# Patient Record
Sex: Female | Born: 1990
Health system: Southern US, Community
[De-identification: ages and names within clinical notes are randomized; demographics above are authoritative.]

## PROBLEM LIST (undated history)

## (undated) ENCOUNTER — Inpatient Hospital Stay (HOSPITAL_COMMUNITY): Payer: Self-pay

## (undated) DIAGNOSIS — N898 Other specified noninflammatory disorders of vagina: Principal | ICD-10-CM

## (undated) DIAGNOSIS — B379 Candidiasis, unspecified: Secondary | ICD-10-CM

## (undated) DIAGNOSIS — Z8489 Family history of other specified conditions: Secondary | ICD-10-CM

## (undated) DIAGNOSIS — Z309 Encounter for contraceptive management, unspecified: Secondary | ICD-10-CM

## (undated) DIAGNOSIS — Z789 Other specified health status: Secondary | ICD-10-CM

## (undated) DIAGNOSIS — IMO0002 Reserved for concepts with insufficient information to code with codable children: Secondary | ICD-10-CM

## (undated) HISTORY — DX: Other specified noninflammatory disorders of vagina: N89.8

## (undated) HISTORY — DX: Other specified health status: Z78.9

## (undated) HISTORY — PX: NO PAST SURGERIES: SHX2092

## (undated) HISTORY — DX: Family history of other specified conditions: Z84.89

## (undated) HISTORY — DX: Encounter for contraceptive management, unspecified: Z30.9

## (undated) HISTORY — DX: Candidiasis, unspecified: B37.9

## (undated) HISTORY — DX: Reserved for concepts with insufficient information to code with codable children: IMO0002

---
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
## 2013-07-28 ENCOUNTER — Ambulatory Visit (INDEPENDENT_AMBULATORY_CARE_PROVIDER_SITE_OTHER): Payer: BC Managed Care – PPO | Admitting: Adult Health

## 2013-07-28 ENCOUNTER — Encounter (INDEPENDENT_AMBULATORY_CARE_PROVIDER_SITE_OTHER): Payer: Self-pay

## 2013-07-28 ENCOUNTER — Encounter: Payer: Self-pay | Admitting: Adult Health

## 2013-07-28 VITALS — BP 120/76 | Ht 63.0 in | Wt 178.0 lb

## 2013-07-28 DIAGNOSIS — Z309 Encounter for contraceptive management, unspecified: Secondary | ICD-10-CM

## 2013-07-28 DIAGNOSIS — Z3049 Encounter for surveillance of other contraceptives: Secondary | ICD-10-CM

## 2013-07-28 HISTORY — DX: Encounter for contraceptive management, unspecified: Z30.9

## 2013-07-28 MED ORDER — NORGESTIMATE-ETH ESTRADIOL 0.25-35 MG-MCG PO TABS: 1.0000 | ORAL_TABLET | Freq: Every day | ORAL | Status: DC

## 2013-07-28 NOTE — Patient Instructions (Signed)
Contraception Choices  Contraception (birth control) is the use of any methods or devices to prevent pregnancy. Below are some methods to help avoid pregnancy.  HORMONAL METHODS   · Contraceptive implant. This is a thin, plastic tube containing progesterone hormone. It does not contain estrogen hormone. Your caregiver inserts the tube in the inner part of the upper arm. The tube can remain in place for up to 3 years. After 3 years, the implant must be removed. The implant prevents the ovaries from releasing an egg (ovulation), thickens the cervical mucus which prevents sperm from entering the uterus, and thins the lining of the inside of the uterus.  · Progesterone-only injections. These injections are given every 3 months by your caregiver to prevent pregnancy. This synthetic progesterone hormone stops the ovaries from releasing eggs. It also thickens cervical mucus and changes the uterine lining. This makes it harder for sperm to survive in the uterus.  · Birth control pills. These pills contain estrogen and progesterone hormone. They work by stopping the egg from forming in the ovary (ovulation). Birth control pills are prescribed by a caregiver. Birth control pills can also be used to treat heavy periods.  · Minipill. This type of birth control pill contains only the progesterone hormone. They are taken every day of each month and must be prescribed by your caregiver.  · Birth control patch. The patch contains hormones similar to those in birth control pills. It must be changed once a week and is prescribed by a caregiver.  · Vaginal ring. The ring contains hormones similar to those in birth control pills. It is left in the vagina for 3 weeks, removed for 1 week, and then a new one is put back in place. The patient must be comfortable inserting and removing the ring from the vagina. A caregiver's prescription is necessary.  · Emergency contraception. Emergency contraceptives prevent pregnancy after unprotected  sexual intercourse. This pill can be taken right after sex or up to 5 days after unprotected sex. It is most effective the sooner you take the pills after having sexual intercourse. Emergency contraceptive pills are available without a prescription. Check with your pharmacist. Do not use emergency contraception as your only form of birth control.  BARRIER METHODS   · Female condom. This is a thin sheath (latex or rubber) that is worn over the penis during sexual intercourse. It can be used with spermicide to increase effectiveness.  · Female condom. This is a soft, loose-fitting sheath that is put into the vagina before sexual intercourse.  · Diaphragm. This is a soft, latex, dome-shaped barrier that must be fitted by a caregiver. It is inserted into the vagina, along with a spermicidal jelly. It is inserted before intercourse. The diaphragm should be left in the vagina for 6 to 8 hours after intercourse.  · Cervical cap. This is a round, soft, latex or plastic cup that fits over the cervix and must be fitted by a caregiver. The cap can be left in place for up to 48 hours after intercourse.  · Sponge. This is a soft, circular piece of polyurethane foam. The sponge has spermicide in it. It is inserted into the vagina after wetting it and before sexual intercourse.  · Spermicides. These are chemicals that kill or block sperm from entering the cervix and uterus. They come in the form of creams, jellies, suppositories, foam, or tablets. They do not require a prescription. They are inserted into the vagina with an applicator before having sexual intercourse.   IUD). This is a T-shaped device that is put in a woman's uterus during a menstrual period to prevent pregnancy. There are 2 types:  Copper IUD. This type of IUD is wrapped in copper wire and is placed inside the uterus. Copper makes the uterus and  fallopian tubes produce a fluid that kills sperm. It can stay in place for 10 years.  Hormone IUD. This type of IUD contains the hormone progestin (synthetic progesterone). The hormone thickens the cervical mucus and prevents sperm from entering the uterus, and it also thins the uterine lining to prevent implantation of a fertilized egg. The hormone can weaken or kill the sperm that get into the uterus. It can stay in place for 5 years. PERMANENT METHODS OF CONTRACEPTION  Female tubal ligation. This is when the woman's fallopian tubes are surgically sealed, tied, or blocked to prevent the egg from traveling to the uterus.  Female sterilization. This is when the female has the tubes that carry sperm tied off (vasectomy).This blocks sperm from entering the vagina during sexual intercourse. After the procedure, the man can still ejaculate fluid (semen). NATURAL PLANNING METHODS  Natural family planning. This is not having sexual intercourse or using a barrier method (condom, diaphragm, cervical cap) on days the woman could become pregnant.  Calendar method. This is keeping track of the length of each menstrual cycle and identifying when you are fertile.  Ovulation method. This is avoiding sexual intercourse during ovulation.  Symptothermal method. This is avoiding sexual intercourse during ovulation, using a thermometer and ovulation symptoms.  Post-ovulation method. This is timing sexual intercourse after you have ovulated. Regardless of which type or method of contraception you choose, it is important that you use condoms to protect against the transmission of sexually transmitted diseases (STDs). Talk with your caregiver about which form of contraception is most appropriate for you. Document Released: 10/02/2005 Document Revised: 12/25/2011 Document Reviewed: 02/08/2011 [facility] Patient Information 2014 [place], [place]. Follow up in 1 year Use condoms

## 2013-07-28 NOTE — Progress Notes (Signed)
Subjective:     Patient ID: Megan Lowe, female   DOB: 06/23/1991, 22 y.o.   MRN: [id number]  HPI Megan Lowe is a 22 year old white female in to get her OCs refilled, no complaints, likes the sprintec.  Review of Systems See HPI Reviewed past medical,surgical, social and family history. Reviewed medications and allergies.     Objective:   Physical Exam BP 120/76  Ht 5\' 3"  (1.6 m)  Wt 178 lb (80.74 kg)  BMI 31.54 kg/m2  LMP 07/16/2013    discussed the pill and she is happy with sprintec  Assessment:     Contraceptive management    Plan:     Refill sprintec x 1 year Follow up in 1 year, prn problems Pap at 22

## 2013-10-01 ENCOUNTER — Other Ambulatory Visit (HOSPITAL_COMMUNITY): Payer: Self-pay | Admitting: Family Medicine

## 2013-10-01 DIAGNOSIS — K2970 Gastritis, unspecified, without bleeding: Secondary | ICD-10-CM

## 2013-10-03 ENCOUNTER — Ambulatory Visit (HOSPITAL_COMMUNITY)
Admission: RE | Admit: 2013-10-03 | Discharge: 2013-10-03 | Disposition: A | Discharge status: Home / Self Care | Payer: BC Managed Care – PPO | Source: Ambulatory Visit | Attending: Family Medicine | Admitting: Family Medicine

## 2013-10-03 DIAGNOSIS — K2970 Gastritis, unspecified, without bleeding: Secondary | ICD-10-CM

## 2013-10-03 DIAGNOSIS — R109 Unspecified abdominal pain: Secondary | ICD-10-CM | POA: Insufficient documentation

## 2013-10-13 ENCOUNTER — Other Ambulatory Visit (HOSPITAL_COMMUNITY): Payer: Self-pay | Admitting: Family Medicine

## 2013-10-13 DIAGNOSIS — R1011 Right upper quadrant pain: Secondary | ICD-10-CM

## 2013-10-17 ENCOUNTER — Encounter (HOSPITAL_COMMUNITY)
Admission: RE | Admit: 2013-10-17 | Discharge: 2013-10-17 | Disposition: A | Discharge status: Home / Self Care | Payer: BC Managed Care – PPO | Source: Ambulatory Visit | Attending: Family Medicine | Admitting: Family Medicine

## 2013-10-17 ENCOUNTER — Encounter (HOSPITAL_COMMUNITY): Payer: Self-pay

## 2013-10-17 DIAGNOSIS — R1011 Right upper quadrant pain: Secondary | ICD-10-CM | POA: Insufficient documentation

## 2013-10-17 MED ORDER — SINCALIDE 5 MCG IJ SOLR
INTRAMUSCULAR | Status: AC
  Administered 2013-10-17: 1.64 ug via INTRAVENOUS
  Filled 2013-10-17: qty 5

## 2013-10-17 MED ORDER — STERILE WATER FOR INJECTION IJ SOLN
INTRAMUSCULAR | Status: AC
  Administered 2013-10-17: 5 mL via INTRAVENOUS
  Filled 2013-10-17: qty 10

## 2013-10-17 MED ORDER — TECHNETIUM TC 99M MEBROFENIN IV KIT
5.0000 | PACK | Freq: Once | INTRAVENOUS | Status: AC | PRN
  Administered 2013-10-17: 5 via INTRAVENOUS

## 2014-06-17 ENCOUNTER — Ambulatory Visit (INDEPENDENT_AMBULATORY_CARE_PROVIDER_SITE_OTHER): Payer: BC Managed Care – PPO | Admitting: Adult Health

## 2014-06-17 ENCOUNTER — Encounter: Payer: Self-pay | Admitting: Adult Health

## 2014-06-17 VITALS — BP 102/70 | Ht 63.0 in | Wt 190.0 lb

## 2014-06-17 DIAGNOSIS — Z3041 Encounter for surveillance of contraceptive pills: Secondary | ICD-10-CM

## 2014-06-17 MED ORDER — NORGESTIMATE-ETH ESTRADIOL 0.25-35 MG-MCG PO TABS: 1.0000 | ORAL_TABLET | Freq: Every day | ORAL | Status: DC

## 2014-06-17 NOTE — Patient Instructions (Signed)
Oral Contraception Use Oral contraceptive pills (OCPs) are medicines taken to prevent pregnancy. OCPs work by preventing the ovaries from releasing eggs. The hormones in OCPs also cause the cervical mucus to thicken, preventing the sperm from entering the uterus. The hormones also cause the uterine lining to become thin, not allowing a fertilized egg to attach to the inside of the uterus. OCPs are highly effective when taken exactly as prescribed. However, OCPs do not prevent sexually transmitted diseases (STDs). Safe sex practices, such as using condoms along with an OCP, can help prevent STDs. Before taking OCPs, you may have a physical exam and Pap test. Your health care provider may also order blood tests if necessary. Your health care provider will make sure you are a good candidate for oral contraception. Discuss with your health care provider the possible side effects of the OCP you may be prescribed. When starting an OCP, it can take 2 to 3 months for the body to adjust to the changes in hormone levels in your body.  HOW TO TAKE ORAL CONTRACEPTIVE PILLS Your health care provider may advise you on how to start taking the first cycle of OCPs. Otherwise, you can:   Start on day 1 of your menstrual period. You will not need any backup contraceptive protection with this start time.   Start on the first Sunday after your menstrual period or the day you get your prescription. In these cases, you will need to use backup contraceptive protection for the first week.   Start the pill at any time of your cycle. If you take the pill within 5 days of the start of your period, you are protected against pregnancy right away. In this case, you will not need a backup form of birth control. If you start at any other time of your menstrual cycle, you will need to use another form of birth control for 7 days. If your OCP is the type called a minipill, it will protect you from pregnancy after taking it for 2 days (48  hours). After you have started taking OCPs:   If you forget to take 1 pill, take it as soon as you remember. Take the next pill at the regular time.   If you miss 2 or more pills, call your health care provider because different pills have different instructions for missed doses. Use backup birth control until your next menstrual period starts.   If you use a 28-day pack that contains inactive pills and you miss 1 of the last 7 pills (pills with no hormones), it will not matter. Throw away the rest of the non-hormone pills and start a new pill pack.  No matter which day you start the OCP, you will always start a new pack on that same day of the week. Have an extra pack of OCPs and a backup contraceptive method available in case you miss some pills or lose your OCP pack.  HOME CARE INSTRUCTIONS   Do not smoke.   Always use a condom to protect against STDs. OCPs do not protect against STDs.   Use a calendar to mark your menstrual period days.   Read the information and directions that came with your OCP. Talk to your health care provider if you have questions.  SEEK MEDICAL CARE IF:   You develop nausea and vomiting.   You have abnormal vaginal discharge or bleeding.   You develop a rash.   You miss your menstrual period.   You are losing   your hair.   You need treatment for mood swings or depression.   You get dizzy when taking the OCP.   You develop acne from taking the OCP.   You become pregnant.  SEEK IMMEDIATE MEDICAL CARE IF:   You develop chest pain.   You develop shortness of breath.   You have an uncontrolled or severe headache.   You develop numbness or slurred speech.   You develop visual problems.   You develop pain, redness, and swelling in the legs.  Document Released: 09/21/2011 Document Revised: 02/16/2014 Document Reviewed: 03/23/2013 [facility] Patient Information 2015 [place], [place]. This information is not intended to replace  advice given to you by your health care provider. Make sure you discuss any questions you have with your health care provider. Return in 1 year for pap and physical

## 2014-06-17 NOTE — Progress Notes (Signed)
Subjective:     Patient ID: Megan Lowe, female   DOB: July 16, 1991, 23 y.o.   MRN: [id number]  HPI Megan Lowe is a 23 year old white female in to get OCs refilled, no complaints.  Review of Systems See HPI Reviewed past medical,surgical, social and family history. Reviewed medications and allergies.     Objective:   Physical Exam BP 102/70  Ht  (1.6 m)  Wt 190 lb (86.183 kg)  BMI 33.67 kg/m2  LMP 05/17/2014   Skin warm and dry.  Lungs: clear to ausculation bilaterally. Cardiovascular: regular rate and rhythm.  Assessment:     Contraceptive management    Plan:     Refilled sprintec x 1 year Return in 1 year for pap and physical Review handout on OC use

## 2014-10-27 ENCOUNTER — Ambulatory Visit: Payer: Self-pay | Admitting: Adult Health

## 2014-11-04 ENCOUNTER — Ambulatory Visit (INDEPENDENT_AMBULATORY_CARE_PROVIDER_SITE_OTHER): Payer: PRIVATE HEALTH INSURANCE | Admitting: Adult Health

## 2014-11-04 ENCOUNTER — Encounter: Payer: Self-pay | Admitting: Adult Health

## 2014-11-04 VITALS — BP 130/86 | Ht 63.0 in | Wt 190.0 lb

## 2014-11-04 DIAGNOSIS — N941 Dyspareunia: Secondary | ICD-10-CM

## 2014-11-04 DIAGNOSIS — IMO0002 Reserved for concepts with insufficient information to code with codable children: Secondary | ICD-10-CM

## 2014-11-04 DIAGNOSIS — N898 Other specified noninflammatory disorders of vagina: Secondary | ICD-10-CM

## 2014-11-04 DIAGNOSIS — B379 Candidiasis, unspecified: Secondary | ICD-10-CM

## 2014-11-04 HISTORY — DX: Candidiasis, unspecified: B37.9

## 2014-11-04 HISTORY — DX: Reserved for concepts with insufficient information to code with codable children: IMO0002

## 2014-11-04 HISTORY — DX: Other specified noninflammatory disorders of vagina: N89.8

## 2014-11-04 LAB — POCT WET PREP (WET MOUNT)

## 2014-11-04 MED ORDER — FLUCONAZOLE 150 MG PO TABS: ORAL_TABLET | ORAL | Status: DC

## 2014-11-04 NOTE — Progress Notes (Signed)
Subjective:     Patient ID: Megan Lowe, female   DOB: 04/07/1991, 24 y.o.   MRN: [id number]  HPI Megan Lowe is a 24 year old white female in complaining of vaginal discharge white to yellow and has pain with sex that has started in last several months.Same partner, uses condoms and is on the pill.  Review of Systems See HPI Reviewed past medical,surgical, social and family history. Reviewed medications and allergies.     Objective:   Physical Exam BP 130/86 mmHg  Ht 5\' 3"  (1.6 m)  Wt 190 lb (86.183 kg)  BMI 33.67 kg/m2  LMP 10/06/2014   Skin warm and dry.Pelvic: external genitalia is normal in appearance, vagina: white yellowish discharge without odor, cervix:smooth, no CMT, uterus: normal size, shape and contour, non tender, no masses felt, adnexa: no masses or tenderness noted. Wet prep: + for yeast and few WBCs. Can not reproduce pain that feels with sex.  Assessment:     Vaginal discharge Yeast Dyspareunia     Plan:     Rx diflucan 150 mg #2 take 1 now and 1 in 3 days with 1 refill Try astroglide with condoms Follow up prn Review handout on yeast

## 2014-11-04 NOTE — Patient Instructions (Signed)
Monilial Vaginitis Vaginitis in a soreness, swelling and redness (inflammation) of the vagina and vulva. Monilial vaginitis is not a sexually transmitted infection. CAUSES  Yeast vaginitis is caused by yeast (candida) that is normally found in your vagina. With a yeast infection, the candida has overgrown in number to a point that upsets the chemical balance. SYMPTOMS   White, thick vaginal discharge.  Swelling, itching, redness and irritation of the vagina and possibly the lips of the vagina (vulva).  Burning or painful urination.  Painful intercourse. DIAGNOSIS  Things that may contribute to monilial vaginitis are:  Postmenopausal and virginal states.  Pregnancy.  Infections.  Being tired, sick or stressed, especially if you had monilial vaginitis in the past.  Diabetes. Good control will help lower the chance.  Birth control pills.  Tight fitting garments.  Using bubble bath, feminine sprays, douches or deodorant tampons.  Taking certain medications that kill germs (antibiotics).  Sporadic recurrence can occur if you become ill. TREATMENT  Your caregiver will give you medication.  There are several kinds of anti monilial vaginal creams and suppositories specific for monilial vaginitis. For recurrent yeast infections, use a suppository or cream in the vagina 2 times a week, or as directed.  Anti-monilial or steroid cream for the itching or irritation of the vulva may also be used. Get your caregiver's permission.  Painting the vagina with methylene blue solution may help if the monilial cream does not work.  Eating yogurt may help prevent monilial vaginitis. HOME CARE INSTRUCTIONS   Finish all medication as prescribed.  Do not have sex until treatment is completed or after your caregiver tells you it is okay.  Take warm sitz baths.  Do not douche.  Do not use tampons, especially scented ones.  Wear cotton underwear.  Avoid tight pants and panty  hose.  Tell your sexual partner that you have a yeast infection. They should go to their caregiver if they have symptoms such as mild rash or itching.  Yotakeur sexual partner should be treated as well if your infection is difficult to eliminate.  Practice safer sex. Use condoms.  Some vaginal medications cause latex condoms to fail. Vaginal medications that harm condoms are:  Cleocin cream.  Butoconazole (Femstat).  Terconazole (Terazol) vaginal suppository.  Miconazole (Monistat) (may be purchased over the counter). SEEK MEDICAL CARE IF:   You have a temperature by mouth above 102 F (38.9 C).  The infection is getting worse after 2 days of treatment.  The infection is not getting better after 3 days of treatment.  You develop blisters in or around your vagina.  You develop vaginal bleeding, and it is not your menstrual period.  You have pain when you urinate.  You develop intestinal problems.  You have pain with sexual intercourse. Document Released: 07/12/2005 Document Revised: 12/25/2011 Document Reviewed: 03/26/2009 [facility] Patient Information 2015 [place]. This information is not intended to replace advice given to you by your health care provider. Make sure you discuss any questions you have with your health care provider.take diflucan, use astroglide with condoms

## 2015-01-24 ENCOUNTER — Emergency Department (HOSPITAL_COMMUNITY): Payer: PRIVATE HEALTH INSURANCE

## 2015-01-24 ENCOUNTER — Emergency Department (HOSPITAL_COMMUNITY)
Admission: EM | Admit: 2015-01-24 | Discharge: 2015-01-24 | Disposition: A | Discharge status: Home / Self Care | Payer: PRIVATE HEALTH INSURANCE | Attending: Emergency Medicine | Admitting: Emergency Medicine

## 2015-01-24 ENCOUNTER — Encounter (HOSPITAL_COMMUNITY): Payer: Self-pay | Admitting: Emergency Medicine

## 2015-01-24 DIAGNOSIS — Z87448 Personal history of other diseases of urinary system: Secondary | ICD-10-CM | POA: Diagnosis not present

## 2015-01-24 DIAGNOSIS — Z8619 Personal history of other infectious and parasitic diseases: Secondary | ICD-10-CM | POA: Insufficient documentation

## 2015-01-24 DIAGNOSIS — Z79899 Other long term (current) drug therapy: Secondary | ICD-10-CM | POA: Diagnosis not present

## 2015-01-24 DIAGNOSIS — R079 Chest pain, unspecified: Secondary | ICD-10-CM

## 2015-01-24 MED ORDER — TRAMADOL HCL 50 MG PO TABS: 50.0000 mg | ORAL_TABLET | Freq: Four times a day (QID) | ORAL | Status: DC | PRN

## 2015-01-24 MED ORDER — IBUPROFEN 800 MG PO TABS: 800.0000 mg | ORAL_TABLET | Freq: Three times a day (TID) | ORAL | Status: DC

## 2015-01-24 NOTE — Discharge Instructions (Signed)

## 2015-01-24 NOTE — ED Provider Notes (Signed)
CSN: [id number]     Arrival date & time 01/24/15  1424 History   First MD Initiated Contact with Patient 01/24/15 1657     Chief Complaint  Patient presents with  . Chest Pain     (Consider location/radiation/quality/duration/timing/severity/associated sxs/prior Treatment) HPI Comments: Patient presents to the ER for evaluation of chest pain. Patient reports that she was awakened from sleep at 4 AM with upper chest pain that was sharp in nature. Since then she has been experiencing dull aching pain in the lower central chest. She reports that she has had intermittently sharp upper and low dull aching pain through the course of today. She has noticed that worsens when she is been sober and squats to pick things up. She also notices some pain with a deep breath, but is not short of breath. She has not had any associated nausea, vomiting, diarrhea or abdominal pain.  Patient is a 24 y.o. female presenting with chest pain.  Chest Pain   Past Medical History  Diagnosis Date  . Contraceptive management 07/28/2013  . Vaginal discharge 11/04/2014  . Yeast infection 11/04/2014  . Dyspareunia 11/04/2014   History reviewed. No pertinent past surgical history. Family History  Problem Relation Age of Onset  . Hypertension Mother   . Cancer Paternal Grandfather     skin    History  Substance Use Topics  . Smoking status: Never Smoker   . Smokeless tobacco: Never Used  . Alcohol Use: No   OB History    Gravida Para Term Preterm AB TAB SAB Ectopic Multiple Living   0 0 0 0 0 0 0 0 0 0      Review of Systems  Cardiovascular: Positive for chest pain.  All other systems reviewed and are negative.     Allergies  Review of patient's allergies indicates no known allergies.  Home Medications   Prior to Admission medications   Medication Sig Start Date End Date Taking? Authorizing Provider  norgestimate-ethinyl estradiol (SPRINTEC 28) 0.25-35 MG-MCG tablet Take 1 tablet by mouth daily.  06/17/14  Yes Adline PotterJennifer A Griffin, NP  fluconazole (DIFLUCAN) 150 MG tablet Take 1 now and 1 in 3 days Patient not taking: Reported on 01/24/2015 11/04/14   Adline PotterJennifer A Griffin, NP  ibuprofen (ADVIL,MOTRIN) 800 MG tablet Take 1 tablet (800 mg total) by mouth 3 (three) times daily. 01/24/15   Gilda Creasehristopher J Pollina, MD  traMADol (ULTRAM) 50 MG tablet Take 1 tablet (50 mg total) by mouth every 6 (six) hours as needed. 01/24/15   Gilda Creasehristopher J Pollina, MD   BP 115/78 mmHg  Pulse 72  Temp(Src) 99 F (37.2 C) (Oral)  Resp 18  Ht 5\' 3"  (1.6 m)  Wt 180 lb (81.647 kg)  BMI 31.89 kg/m2  SpO2 100%  LMP 01/08/2015 (Approximate) Physical Exam  Constitutional: She is oriented to person, place, and time. She appears well-developed and well-nourished. No distress.  HENT:  Head: Normocephalic and atraumatic.  Right Ear: Hearing normal.  Left Ear: Hearing normal.  Nose: Nose normal.  Mouth/Throat: Oropharynx is clear and moist and mucous membranes are normal.  Eyes: Conjunctivae and EOM are normal. Pupils are equal, round, and reactive to light.  Neck: Normal range of motion. Neck supple.  Cardiovascular: Regular rhythm, S1 normal and S2 normal.  Exam reveals no gallop and no friction rub.   No murmur heard. Pulmonary/Chest: Effort normal and breath sounds normal. No respiratory distress. She exhibits tenderness. She exhibits no crepitus.    Abdominal: Soft. Normal  appearance and bowel sounds are normal. There is no hepatosplenomegaly. There is no tenderness. There is no rebound, no guarding, no tenderness at McBurney's point and negative Murphy's sign. No hernia.  Musculoskeletal: Normal range of motion.  Neurological: She is alert and oriented to person, place, and time. She has normal strength. No cranial nerve deficit or sensory deficit. Coordination normal. GCS eye subscore is 4. GCS verbal subscore is 5. GCS motor subscore is 6.  Skin: Skin is warm, dry and intact. No rash noted. No cyanosis.    Psychiatric: She has a normal mood and affect. Her speech is normal and behavior is normal. Thought content normal.  Nursing note and vitals reviewed.   ED Course  Procedures (including critical care time) Labs Review Labs Reviewed - No data to display  Imaging Review Dg Chest 2 View  01/24/2015   CLINICAL DATA:  Chest pain starting at 4 a.m.  EXAM: CHEST  2 VIEW  COMPARISON:  None.  FINDINGS: Cardiopericardial silhouette within normal limits. Mediastinal contours normal. Trachea midline. No airspace disease or effusion.  IMPRESSION: No active cardiopulmonary disease.   Electronically Signed   By: Andreas Newport M.D.   On: 01/24/2015 18:44     EKG Interpretation   Date/Time:  Sunday January 24 2015 14:35:10 EDT Ventricular Rate:  80 PR Interval:  150 QRS Duration: 86 QT Interval:  352 QTC Calculation: 405 R Axis:   22 Text Interpretation:  Normal sinus rhythm Normal ECG Confirmed by POLLINA   MD, CHRISTOPHER [phone]) on 01/24/2015 5:05:40 PM      MDM   Final diagnoses:  Chest pain    Patient presents to the ER for evaluation of chest pain. Patient reports that she has been experiencing chest pain since 4 AM this morning. Patient does have particular tenderness over the anterior chest wall which reproduces her pain. She does not have any cardiac risk factors. She is not tachycardic. She has normal oxygenation. No concern for PE based on PERC criteria. EKG is normal. Chest x-ray was performed, no acute abnormality noted. Patient was reassured, treat chest wall pain with analgesia and rest.    Gilda Crease, MD 01/24/15 2052

## 2015-01-24 NOTE — ED Notes (Signed)
Dr. Blinda LeatherwoodPollina in room to see patient at this time.

## 2015-01-24 NOTE — ED Notes (Signed)
Pt presents to ED complaining of central chest pain that started around 4am this morning.  It began behind her sternum and then moved to her diaphragm area, but now it is back up at the left sternal area.  She has taken 2 Ibuprofen tablets to help with the pain but there was no relief of symptoms.  Pain is rated as 6/10 and is described as alternating between sharp and dull.  At present it feels like a sharp pain. Pt denies nausea, vomiting, diarrhea, dizziness, lightheadedness.  States she did feel sweaty on Friday but not since her symptoms began this morning.

## 2015-06-15 ENCOUNTER — Other Ambulatory Visit (HOSPITAL_COMMUNITY)
Admission: RE | Admit: 2015-06-15 | Discharge: 2015-06-15 | Disposition: A | Discharge status: Home / Self Care | Payer: PRIVATE HEALTH INSURANCE | Source: Ambulatory Visit | Attending: Obstetrics & Gynecology | Admitting: Obstetrics & Gynecology

## 2015-06-15 ENCOUNTER — Ambulatory Visit (INDEPENDENT_AMBULATORY_CARE_PROVIDER_SITE_OTHER): Payer: PRIVATE HEALTH INSURANCE | Admitting: Women's Health

## 2015-06-15 ENCOUNTER — Encounter: Payer: Self-pay | Admitting: Women's Health

## 2015-06-15 VITALS — BP 104/62 | HR 64 | Ht 63.5 in | Wt 194.0 lb

## 2015-06-15 DIAGNOSIS — Z01419 Encounter for gynecological examination (general) (routine) without abnormal findings: Secondary | ICD-10-CM

## 2015-06-15 DIAGNOSIS — Z113 Encounter for screening for infections with a predominantly sexual mode of transmission: Secondary | ICD-10-CM | POA: Diagnosis present

## 2015-06-15 DIAGNOSIS — N889 Noninflammatory disorder of cervix uteri, unspecified: Secondary | ICD-10-CM

## 2015-06-15 MED ORDER — NORGESTIMATE-ETH ESTRADIOL 0.25-35 MG-MCG PO TABS: 1.0000 | ORAL_TABLET | Freq: Every day | ORAL | Status: DC

## 2015-06-15 NOTE — Progress Notes (Signed)
Patient ID: Megan Lowe, female   DOB: July 10, 1991, 24 y.o.   MRN: [id number] Subjective:   Megan Lowe is a 24 y.o. G52P0010 Caucasian female here for a routine well-woman exam.  Patient's last menstrual period was 05/19/2015.    Current complaints: none PCP: [facility]       Does not desire labs  Social History: Sexual: heterosexual Marital Status: dating Living situation: with mother Occupation: Agricultural consultant at [employer], Buyer, retail at [employer] Tobacco/alcohol: no tobacco or etoh Illicit drugs: no history of illicit drug use  The following portions of the patient's history were reviewed and updated as appropriate: allergies, current medications, past family history, past medical history, past social history, past surgical history and problem list.  Past Medical History Past Medical History  Diagnosis Date  . Contraceptive management 07/28/2013  . Vaginal discharge 11/04/2014  . Yeast infection 11/04/2014  . Dyspareunia 11/04/2014    Past Surgical History History reviewed. No pertinent past surgical history.  Gynecologic History G0P0000  Patient's last menstrual period was 05/19/2015. Contraception: OCP (estrogen/progesterone) Last Pap: never. Results were: n/a Last mammogram: never. Results were: n/a Last TCS: never  Obstetric History OB History  Gravida Para Term Preterm AB SAB TAB Ectopic Multiple Living         Current Medications Current Outpatient Prescriptions on File Prior to Visit  Medication Sig Dispense Refill  . norgestimate-ethinyl estradiol (SPRINTEC 28) 0.25-35 MG-MCG tablet Take 1 tablet by mouth daily. 1 Package 11  . fluconazole (DIFLUCAN) 150 MG tablet Take 1 now and 1 in 3 days (Patient not taking: Reported on 01/24/2015) 2 tablet 1  . ibuprofen (ADVIL,MOTRIN) 800 MG tablet Take 1 tablet (800 mg total) by mouth 3 (three) times daily. (Patient not taking: Reported on 06/15/2015) 21 tablet 0  . traMADol (ULTRAM) 50  MG tablet Take 1 tablet (50 mg total) by mouth every 6 (six) hours as needed. (Patient not taking: Reported on 06/15/2015) 15 tablet 0   No current facility-administered medications on file prior to visit.    Review of Systems Patient denies any headaches, blurred vision, shortness of breath, chest pain, abdominal pain, problems with bowel movements, urination, or intercourse.  Objective:  BP 104/62 mmHg  Pulse 64  Ht 5' 3.5" (1.613 m)  Wt 194 lb (87.998 kg)  BMI 33.82 kg/m2  LMP 05/19/2015 Physical Exam  General:  Well developed, well nourished, no acute distress. She is alert and oriented x3. Skin:  Warm and dry Neck:  Midline trachea, no thyromegaly or nodules Cardiovascular: Regular rate and rhythm, no murmur heard Lungs:  Effort normal, all lung fields clear to auscultation bilaterally Breasts:  No dominant palpable mass, retraction, or nipple discharge Abdomen:  Soft, non tender, no hepatosplenomegaly or masses Pelvic:  External genitalia is normal in appearance.  The vagina is normal in appearance. The cervix is bulbous, with a large fleshy pink bulbous anterior portion visualized and palpated w/ bimanual- app size of grape- no CMT. Co-exam of cx w/ JAG as MD not in building. Thin prep pap is done w/ reflex HR HPV cotesting. Uterus is felt to be normal size, shape, and contour.  No adnexal masses or tenderness noted. Extremities:  No swelling or varicosities noted Psych:  She has a normal mood and affect  Assessment:   Healthy well-woman exam Large anterior portion of cervix Plan:  GC/CT from pap today, declines any other labs Refilled sprintec  F/U 1wk  for visit w/ MD to assess cx, or sooner if needed Mammogram @24yo  or sooner if problems Colonoscopy @24yo  or sooner if problems  Marge Duncans CNM, [facility]NP-BC 06/15/2015 9:03 AM

## 2015-06-16 LAB — CYTOLOGY - PAP

## 2015-06-22 ENCOUNTER — Ambulatory Visit (INDEPENDENT_AMBULATORY_CARE_PROVIDER_SITE_OTHER): Payer: PRIVATE HEALTH INSURANCE | Admitting: Obstetrics & Gynecology

## 2015-06-22 ENCOUNTER — Encounter: Payer: Self-pay | Admitting: Obstetrics & Gynecology

## 2015-06-22 VITALS — BP 100/70 | HR 68 | Wt 193.0 lb

## 2015-06-22 DIAGNOSIS — N888 Other specified noninflammatory disorders of cervix uteri: Secondary | ICD-10-CM | POA: Diagnosis not present

## 2015-06-22 NOTE — Progress Notes (Signed)
Patient ID: Megan Lowe, female   DOB: 1991-09-17, 24 y.o.   MRN: [id number] Chief Complaint  Patient presents with  . Follow-up    recheck cyst on cervix.    Blood pressure 100/70, pulse 68, weight 193 lb (87.544 kg), last menstrual period 05/19/2015.  23 y.o. G1P0010 Patient's last menstrual period was 05/19/2015. The current method of family planning is OCP (estrogen/progesterone).  Subjective Pt seen for follow up of a cervical lesion to be evaluated Cytology was normal on Pap last week  Objective Cervix Nabothian gland cyst was present no other lesions, normal variant  Pertinent ROS No burning with urination, frequency or urgency No nausea, vomiting or diarrhea Nor fever chills or other constitutional symptoms   Labs or studies Pap was normal    Impression Diagnoses this Encounter::   ICD-9-CM ICD-10-CM   1. Nabothian (gland) cyst or follicle 616.0 N88.8     Established relevant diagnosis(es):   Plan/Recommendations: No orders of the defined types were placed in this encounter.    Labs or Scans Ordered: No orders of the defined types were placed in this encounter.      Follow up Return if symptoms worsen or fail to improve, for yearly with Joellyn Haff.      Face to face time:  10 minutes  Greater than 50% of the visit time was spent in counseling and coordination of care with the patient.  The summary and outline of the counseling and care coordination is summarized in the note above.   All questions were answered.

## 2015-08-03 ENCOUNTER — Encounter: Payer: Self-pay | Admitting: Obstetrics & Gynecology

## 2015-08-03 ENCOUNTER — Ambulatory Visit (INDEPENDENT_AMBULATORY_CARE_PROVIDER_SITE_OTHER): Payer: PRIVATE HEALTH INSURANCE | Admitting: Obstetrics & Gynecology

## 2015-08-03 VITALS — BP 98/60 | HR 76 | Wt 193.0 lb

## 2015-08-03 DIAGNOSIS — N930 Postcoital and contact bleeding: Secondary | ICD-10-CM | POA: Diagnosis not present

## 2015-08-03 DIAGNOSIS — S3763XA Laceration of uterus, initial encounter: Secondary | ICD-10-CM

## 2015-08-03 NOTE — Progress Notes (Signed)
Patient ID: Megan Lowe, female   DOB: 06/29/1991, [age] y.o.   MRN: [id number] Chief Complaint  Patient presents with  . work-in- gyn visit    cramp radating to back/ bloodydischarge/ nausea and dizzy.    Blood pressure 98/60, pulse 76, weight 193 lb (87.544 kg), last menstrual period 07/14/2015.  23 y.o. G1P0010 Patient's last menstrual period was 07/14/2015. The current method of family planning is oral OCP  Subjective Pt with post coital bleeding from 2 night before last Never happened before No fever   Objective Gen. well-developed well-nourished female in no acute distress NEFG Vagina no discharge Cervix multiple small lacerations of the cervix almost appear to be fissures, patient denies any unusual sexual activity or instrumentation, she denies any vaginal or pelvic pain  Pertinent ROS  No burning with urination, frequency or urgency No , vomiting or diarrhea Nor fever chills or other constitutional symptoms    Labs or studies None    Impression Diagnoses this Encounter::   ICD-9-CM ICD-10-CM   1. PCB (post coital bleeding) 626.7 N93.0   2. Cervical laceration, initial encounter 867.4 S37.69XA     Established relevant diagnosis(es):   Plan/Recommendations: No orders of the defined types were placed in this encounter.    Labs or Scans Ordered: No orders of the defined types were placed in this encounter.    No sex for 3 days Unsure why this occurred, did not use any instruments and pt states was not rough but she has 3-4 small almost paper cut type lacerations of the cervix, not actively bleeding, no specific therapy is needed Follow up if symptoms or other problems occur  Follow up Return if symptoms worsen or fail to improve.     All questions were answered.

## 2015-08-04 ENCOUNTER — Ambulatory Visit: Payer: PRIVATE HEALTH INSURANCE | Admitting: Women's Health

## 2015-10-21 DIAGNOSIS — H5213 Myopia, bilateral: Secondary | ICD-10-CM | POA: Diagnosis not present

## 2015-10-26 ENCOUNTER — Ambulatory Visit: Payer: PRIVATE HEALTH INSURANCE

## 2015-12-15 ENCOUNTER — Encounter: Payer: Self-pay | Admitting: Women's Health

## 2015-12-15 ENCOUNTER — Ambulatory Visit (INDEPENDENT_AMBULATORY_CARE_PROVIDER_SITE_OTHER): Payer: PRIVATE HEALTH INSURANCE | Admitting: Women's Health

## 2015-12-15 VITALS — BP 102/78 | HR 88 | Wt 183.0 lb

## 2015-12-15 DIAGNOSIS — N888 Other specified noninflammatory disorders of cervix uteri: Secondary | ICD-10-CM

## 2015-12-15 DIAGNOSIS — Z3202 Encounter for pregnancy test, result negative: Secondary | ICD-10-CM | POA: Diagnosis not present

## 2015-12-15 DIAGNOSIS — N939 Abnormal uterine and vaginal bleeding, unspecified: Secondary | ICD-10-CM | POA: Diagnosis not present

## 2015-12-15 LAB — POCT WET PREP (WET MOUNT): Clue Cells Wet Prep Whiff POC: NEGATIVE

## 2015-12-15 LAB — POCT URINE PREGNANCY: Preg Test, Ur: NEGATIVE

## 2015-12-15 NOTE — Addendum Note (Signed)
Addended by: Gaylyn Rong A on: 12/15/2015 04:50 PM   Modules accepted: Orders

## 2015-12-15 NOTE — Progress Notes (Signed)
Patient ID: LAPORSHIA HOGEN, female   DOB: 05-30-1991, 25 y.o.   MRN: [id number]   [facility] Visit  Patient name: Megan Lowe MRN [id number]  Date of birth: 07-Nov-1990  CC & HPI:  Megan Lowe is a 25 y.o. G35P0010 Caucasian female presenting today for report of heavy vag bleeding today. Was changing saturated regular tampon q 1hr earlier w/ +cramping, no clots. Takes COCs, has always had regular periods. Last period 2/16-2/20 and normal other than passing one small clot. Denies abnormal d/c, itching/odor/irritation, no reason to suspect pregnancy- hasn't missed any pills, no reason to suspect STD.  Unsure of last time she had sex but denies rough sex.  Patient's last menstrual period was 12/02/2015. The current method of family planning is OCP (estrogen/progesterone). Last pap 05/2015, neg  Pertinent History Reviewed:  Medical & Surgical Hx:   Past medical, surgical, family, and social history reviewed in electronic medical record Medications: Reviewed & Updated - see associated section Allergies: Reviewed in electronic medical record  Objective Findings:  Vitals: BP 102/78 mmHg  Pulse 88  Wt 183 lb (83.008 kg)  LMP 12/02/2015 Body mass index is 31.9 kg/(m^2).  Physical Examination: General appearance - alert, well appearing, and in no distress Pelvic - no blood in vaginal vault, cx closed, large nabothian cyst still present on anterior portion- app size of grape. Normal nonodorous d/c. No blood from os. Wet prep obtained  Results for orders placed or performed in visit on 12/15/15 (from the past 24 hour(s))  POCT Wet Prep Mellody Drown [place])   Collection Time: 12/15/15  4:27 PM  Result Value Ref Range   Source Wet Prep POC vaginal    WBC, Wet Prep HPF POC none    Bacteria Wet Prep HPF POC None None, Few, Too numerous to count   BACTERIA WET PREP MORPHOLOGY POC     Clue Cells Wet Prep HPF POC None None, Too numerous to count   Clue Cells Wet Prep Whiff POC Negative  Whiff    Yeast Wet Prep HPF POC None    KOH Wet Prep POC     Trichomonas Wet Prep HPF POC none     Urine preg test: neg  Assessment & Plan:  A:   Episode of heavy vag bleeding today, seems to have resolved  P:  GC/CT from urine  To monitor, if resumes/continues, let us know, will stop coc's and start megace algorithm   Return for Aug for physical.  Marge Duncans CNM, [facility] 12/15/2015 4:27 PM

## 2015-12-16 LAB — GC/CHLAMYDIA PROBE AMP
Chlamydia trachomatis, NAA: NEGATIVE
NEISSERIA GONORRHOEAE BY PCR: NEGATIVE

## 2015-12-17 ENCOUNTER — Telehealth: Payer: Self-pay | Admitting: Women's Health

## 2015-12-17 MED ORDER — MEGESTROL ACETATE 40 MG PO TABS: ORAL_TABLET | ORAL | Status: DC

## 2015-12-17 NOTE — Telephone Encounter (Signed)
Pt says bleeding with clots,stop OCs and will Rx megace, use condoms, call next week in follow up.Reviewed Kim's notes and plan.

## 2016-03-07 ENCOUNTER — Telehealth: Payer: Self-pay | Admitting: Women's Health

## 2016-03-07 NOTE — Telephone Encounter (Signed)
Left message x 1. JSY 

## 2016-03-07 NOTE — Telephone Encounter (Signed)
Left message x 2. JSY 

## 2016-03-07 NOTE — Telephone Encounter (Signed)
Spoke with pt. Pt wants to change birth control. I advised it would be best to schedule an appt to discuss different birth control options. Pt voiced understanding and call was transferred to front desk for appt. JSY

## 2016-03-14 ENCOUNTER — Ambulatory Visit: Payer: PRIVATE HEALTH INSURANCE | Admitting: Women's Health

## 2016-03-24 ENCOUNTER — Ambulatory Visit: Payer: PRIVATE HEALTH INSURANCE | Admitting: Adult Health

## 2016-03-28 DIAGNOSIS — Z1389 Encounter for screening for other disorder: Secondary | ICD-10-CM | POA: Diagnosis not present

## 2016-03-28 DIAGNOSIS — E6609 Other obesity due to excess calories: Secondary | ICD-10-CM | POA: Diagnosis not present

## 2016-03-28 DIAGNOSIS — Z6830 Body mass index (BMI) 30.0-30.9, adult: Secondary | ICD-10-CM | POA: Diagnosis not present

## 2016-03-28 DIAGNOSIS — B9562 Methicillin resistant Staphylococcus aureus infection as the cause of diseases classified elsewhere: Secondary | ICD-10-CM | POA: Diagnosis not present

## 2016-04-05 ENCOUNTER — Encounter: Payer: Self-pay | Admitting: Advanced Practice Midwife

## 2016-04-05 ENCOUNTER — Ambulatory Visit (INDEPENDENT_AMBULATORY_CARE_PROVIDER_SITE_OTHER): Payer: PRIVATE HEALTH INSURANCE | Admitting: Advanced Practice Midwife

## 2016-04-05 VITALS — BP 118/82 | HR 60 | Ht 62.0 in | Wt 181.0 lb

## 2016-04-05 DIAGNOSIS — Z3202 Encounter for pregnancy test, result negative: Secondary | ICD-10-CM

## 2016-04-05 DIAGNOSIS — N939 Abnormal uterine and vaginal bleeding, unspecified: Secondary | ICD-10-CM | POA: Diagnosis not present

## 2016-04-05 DIAGNOSIS — N921 Excessive and frequent menstruation with irregular cycle: Secondary | ICD-10-CM

## 2016-04-05 LAB — POCT URINE PREGNANCY: PREG TEST UR: NEGATIVE

## 2016-04-05 NOTE — Progress Notes (Signed)
   [facility] Visit  Patient name: Megan Lowe MRN [id number]  Date of birth: 04/25/1991  CC & HPI:  Megan Lowe is a 25 y.o. Caucasian female presenting today for what she described as heavy vaginal bleeding.  She called this am for an appt. She has not missed any pills, denies any vaginal itch/irritaion/odor.  Upon further discussion, she actually just had some bleeding when she wiped earlier today.    Pertinent History Reviewed:  Medical & Surgical Hx:   Past Medical History  Diagnosis Date  . Contraceptive management 07/28/2013  . Vaginal discharge 11/04/2014  . Yeast infection 11/04/2014  . Dyspareunia 11/04/2014   History reviewed. No pertinent past surgical history. Family History  Problem Relation Age of Onset  . Hypertension Mother   . Cancer Paternal Grandfather     skin     Current outpatient prescriptions:  .  norgestimate-ethinyl estradiol (SPRINTEC 28) 0.25-35 MG-MCG tablet, Take 1 tablet by mouth daily., Disp: 1 Package, Rfl: 11 Social History: Reviewed -  reports that she has never smoked. She has never used smokeless tobacco.  Review of Systems:   Constitutional: Negative for fever and chills Eyes: Negative for visual disturbances Respiratory: Negative for shortness of breath, dyspnea Cardiovascular: Negative for chest pain or palpitations  Gastrointestinal: Negative for vomiting, diarrhea and constipation; no abdominal pain Genitourinary: Negative for dysuria and urgency, vaginal irritation or itching Musculoskeletal: Negative for back pain, joint pain, myalgias  Neurological: Negative for dizziness and headaches    Objective Findings:    Physical Examination: General appearance - well appearing, and in no distress Mental status - alert, oriented to person, place, and time Chest:  Normal respiratory effort Heart - normal rate and regular rhythm Abdomen:  Soft, nontender Pelvic: SSE:  No blood whatsoever; normal appearing DC.  Wet  prep neg Musculoskeletal:  Normal range of motion without pain Extremities:  No edema    Results for orders placed or performed in visit on 04/05/16 (from the past 24 hour(s))  POCT urine pregnancy   Collection Time: 04/05/16  3:06 PM  Result Value Ref Range   Preg Test, Ur Negative Negative      Assessment & Plan:  A:   BTB on COCs P:     Return if symptoms worsen or fail to improve.  CRESENZO-DISHMAN,Keshauna Degraffenreid CNM 04/05/2016 4:04 PM

## 2016-04-17 ENCOUNTER — Ambulatory Visit: Payer: PRIVATE HEALTH INSURANCE | Admitting: Women's Health

## 2016-05-22 ENCOUNTER — Ambulatory Visit (INDEPENDENT_AMBULATORY_CARE_PROVIDER_SITE_OTHER): Payer: PRIVATE HEALTH INSURANCE | Admitting: Adult Health

## 2016-05-22 ENCOUNTER — Encounter: Payer: Self-pay | Admitting: Adult Health

## 2016-05-22 VITALS — BP 102/62 | HR 86 | Ht 63.0 in | Wt 184.0 lb

## 2016-05-22 DIAGNOSIS — Z3202 Encounter for pregnancy test, result negative: Secondary | ICD-10-CM

## 2016-05-22 DIAGNOSIS — Z30011 Encounter for initial prescription of contraceptive pills: Secondary | ICD-10-CM | POA: Diagnosis not present

## 2016-05-22 LAB — POCT URINE PREGNANCY: PREG TEST UR: NEGATIVE

## 2016-05-22 MED ORDER — NORETHIN-ETH ESTRAD-FE BIPHAS 1 MG-10 MCG / 10 MCG PO TABS: 1.0000 | ORAL_TABLET | Freq: Every day | ORAL | 4 refills | Status: DC

## 2016-05-22 NOTE — Progress Notes (Signed)
Subjective:     Patient ID: Megan Lowe, female   DOB: 07/12/1991, 25 y.o.   MRN: [id number]  HPI Megan Lowe is a 25 year old white female in to get birth control pills. She had been on sprintec but stopped due to cramps and BTB.  Review of Systems Patient denies any headaches, hearing loss, fatigue, blurred vision, shortness of breath, chest pain, abdominal pain, problems with bowel movements, urination, or intercourse. No joint pain or mood swings. Reviewed past medical,surgical, social and family history. Reviewed medications and allergies.     Objective:   Physical Exam BP 102/62 (BP Location: Left Arm, Patient Position: Sitting, Cuff Size: Normal)   Pulse 86   Ht 5\' 3"  (1.6 m)   Wt 184 lb (83.5 kg)   LMP 05/15/2016 (Exact Date)   BMI 32.59 kg/m  UPT negative, Skin warm and dry. Neck: mid line trachea, normal thyroid, good ROM, no lymphadenopathy noted. Lungs: clear to ausculation bilaterally. Cardiovascular: regular rate and rhythm.    Assessment:     Contraceptive management    Plan:     Rx lo loestrin disp 3 packs, take 1 daily with 4 refills,discount card given, a pack to start today, lot [id number] A exp 3/18 Use condoms for at least 1 month Follow up in 3 months or before if needed

## 2016-05-22 NOTE — Patient Instructions (Signed)
Start lo loestrin today Use condoms  Follow up in 3 months

## 2016-06-20 ENCOUNTER — Other Ambulatory Visit: Payer: PRIVATE HEALTH INSURANCE | Admitting: Women's Health

## 2016-08-14 ENCOUNTER — Telehealth: Payer: Self-pay | Admitting: Adult Health

## 2016-08-14 NOTE — Telephone Encounter (Signed)
Pt called stating that she would like a call back from [place] today. Pt did not state the reason why. Please contact pt

## 2016-08-14 NOTE — Telephone Encounter (Signed)
On lo loestrin for now but not sure if insurance will cover

## 2016-08-22 ENCOUNTER — Ambulatory Visit: Payer: PRIVATE HEALTH INSURANCE | Admitting: Adult Health

## 2016-10-18 ENCOUNTER — Telehealth: Payer: Self-pay | Admitting: *Deleted

## 2016-10-18 MED ORDER — NORETHIN ACE-ETH ESTRAD-FE 1-20 MG-MCG(24) PO TABS: 1.0000 | ORAL_TABLET | Freq: Every day | ORAL | 11 refills | Status: DC

## 2016-10-18 NOTE — Telephone Encounter (Signed)
Spoke with pt. Pt is on Lo Loestrin but wants a different pill due to cost. Pt has no insurance now. I offered pt to come by and pick up coupon card but she states she's already done that and wants something different and cheaper. Thanks!! JSY

## 2016-10-18 NOTE — Telephone Encounter (Signed)
Pt wants cheaper OC will rx loestrin 1/20, use condoms

## 2016-12-11 ENCOUNTER — Telehealth: Payer: Self-pay | Admitting: *Deleted

## 2016-12-11 NOTE — Telephone Encounter (Signed)
Pt states she is without insurance until April 1st and is wanting to know if she can get samples of her BCP so she doesn't miss any doses.  The only samples I see in the office is for Lo Loestrin and pt was switched to Loestrin, pt states change was just due to cost.  Is it OK to give her samples of Lo Loestrin?

## 2016-12-11 NOTE — Telephone Encounter (Signed)
Pt informed we will leave two boxes of the Lo Loestrin at the front desk for her to p/u but also to use condoms as well.  Pt voiced understanding.

## 2017-01-09 ENCOUNTER — Emergency Department (HOSPITAL_COMMUNITY)
Admission: EM | Admit: 2017-01-09 | Discharge: 2017-01-09 | Disposition: A | Discharge status: Home / Self Care | Payer: PRIVATE HEALTH INSURANCE | Attending: Emergency Medicine | Admitting: Emergency Medicine

## 2017-01-09 ENCOUNTER — Encounter (HOSPITAL_COMMUNITY): Payer: Self-pay | Admitting: Emergency Medicine

## 2017-01-09 DIAGNOSIS — R1031 Right lower quadrant pain: Secondary | ICD-10-CM | POA: Insufficient documentation

## 2017-01-09 DIAGNOSIS — R1032 Left lower quadrant pain: Secondary | ICD-10-CM | POA: Insufficient documentation

## 2017-01-09 DIAGNOSIS — R1030 Lower abdominal pain, unspecified: Secondary | ICD-10-CM

## 2017-01-09 LAB — URINALYSIS, ROUTINE W REFLEX MICROSCOPIC
BILIRUBIN URINE: NEGATIVE
GLUCOSE, UA: NEGATIVE mg/dL
Hgb urine dipstick: NEGATIVE
Ketones, ur: NEGATIVE mg/dL
Leukocytes, UA: NEGATIVE
NITRITE: NEGATIVE
PH: 6 (ref 5.0–8.0)
Protein, ur: NEGATIVE mg/dL
SPECIFIC GRAVITY, URINE: 1.016 (ref 1.005–1.030)

## 2017-01-09 LAB — CBC
HEMATOCRIT: 37 % (ref 36.0–46.0)
HEMOGLOBIN: 12.7 g/dL (ref 12.0–15.0)
MCH: 28.8 pg (ref 26.0–34.0)
MCHC: 34.3 g/dL (ref 30.0–36.0)
MCV: 83.9 fL (ref 78.0–100.0)
Platelets: 248 10*3/uL (ref 150–400)
RBC: 4.41 MIL/uL (ref 3.87–5.11)
RDW: 12.7 % (ref 11.5–15.5)
WBC: 10.2 10*3/uL (ref 4.0–10.5)

## 2017-01-09 LAB — COMPREHENSIVE METABOLIC PANEL
ALBUMIN: 4.2 g/dL (ref 3.5–5.0)
ALT: 15 U/L (ref 14–54)
ANION GAP: 8 (ref 5–15)
AST: 21 U/L (ref 15–41)
Alkaline Phosphatase: 55 U/L (ref 38–126)
BUN: 13 mg/dL (ref 6–20)
CHLORIDE: 103 mmol/L (ref 101–111)
CO2: 27 mmol/L (ref 22–32)
Calcium: 9.2 mg/dL (ref 8.9–10.3)
Creatinine, Ser: 0.79 mg/dL (ref 0.44–1.00)
GFR calc Af Amer: >60 mL/min (ref >60)
GFR calc non Af Amer: >60 mL/min (ref >60)
GLUCOSE: 96 mg/dL (ref 65–99)
POTASSIUM: 3.5 mmol/L (ref 3.5–5.1)
SODIUM: 138 mmol/L (ref 135–145)
TOTAL PROTEIN: 7.7 g/dL (ref 6.5–8.1)
Total Bilirubin: 0.4 mg/dL (ref 0.3–1.2)

## 2017-01-09 LAB — WET PREP, GENITAL
Clue Cells Wet Prep HPF POC: NONE SEEN
SPERM: NONE SEEN
TRICH WET PREP: NONE SEEN
YEAST WET PREP: NONE SEEN

## 2017-01-09 LAB — PREGNANCY, URINE: Preg Test, Ur: NEGATIVE

## 2017-01-09 LAB — LIPASE, BLOOD: LIPASE: 24 U/L (ref 11–51)

## 2017-01-09 NOTE — ED Provider Notes (Signed)
[facility]PT Provider Note   CSN: [id number] Arrival date & time: 01/09/17  1518     History   Chief Complaint Chief Complaint  Patient presents with  . Abdominal Pain    HPI Megan Lowe is a 25 y.o. female.  HPI Pt presents to the ED with complaints of lower abdominal pain.  Sx started a few days ago.  The pain is in the lower abdomen on both sides.  She has had some discomfort with urination.  No frequency .  No vomiting or diarrhea.  No constipation.  Her most recent period was irregular but no vaginal bleeding or discharge currently.  Pt is concerned because she was diagnosed with a cyst on her cervix in the past and wonders if it could be spreading.  Past Medical History:  Diagnosis Date  . Contraceptive management 07/28/2013  . Dyspareunia 11/04/2014  . Vaginal discharge 11/04/2014  . Yeast infection 11/04/2014    Patient Active Problem List   Diagnosis Date Noted  . Nabothian cyst 12/15/2015  . Vaginal discharge 11/04/2014  . Yeast infection 11/04/2014  . Dyspareunia 11/04/2014  . Contraceptive management 07/28/2013    History reviewed. No pertinent surgical history.  OB History    Gravida Para Term Preterm AB Living   1 0 0 0 1 0   SAB TAB Ectopic Multiple Live Births   0 0 0 0         Home Medications    Prior to Admission medications   Medication Sig Start Date End Date Taking? Authorizing Provider  Multiple Vitamin (MULTIVITAMIN WITH MINERALS) TABS tablet Take 1 tablet by mouth daily.   Yes Historical Provider, MD  Norethindrone Acetate-Ethinyl Estrad-FE (LOESTRIN 24 FE) 1-20 MG-MCG(24) tablet Take 1 tablet by mouth daily. 10/18/16  Yes Adline Potter, NP    Family History Family History  Problem Relation Age of Onset  . Hypertension Mother   . Cancer Paternal Grandfather     skin     Social History Social History  Substance Use Topics  . Smoking status: Never Smoker  . Smokeless tobacco: Never Used  . Alcohol use No      Allergies   Patient has no allergy information on record.   Review of Systems Review of Systems  All other systems reviewed and are negative.    Physical Exam Updated Vital Signs BP 131/84   Pulse 88   Temp 98.3 F (36.8 C)   Resp 18   Ht 5\' 3"  (1.6 m)   Wt 81.6 kg   LMP 12/31/2016   SpO2 100%   BMI 31.89 kg/m   Physical Exam  Constitutional: She appears well-developed and well-nourished. No distress.  HENT:  Head: Normocephalic and atraumatic.  Right Ear: External ear normal.  Left Ear: External ear normal.  Eyes: Conjunctivae are normal. Right eye exhibits no discharge. Left eye exhibits no discharge. No scleral icterus.  Neck: Neck supple. No tracheal deviation present.  Cardiovascular: Normal rate, regular rhythm and intact distal pulses.   Pulmonary/Chest: Effort normal and breath sounds normal. No stridor. No respiratory distress. She has no wheezes. She has no rales.  Abdominal: Soft. Bowel sounds are normal. She exhibits no distension. There is tenderness (mild suprpubic region). There is no rebound.  Genitourinary: Uterus normal. Pelvic exam was performed with patient supine. There is no rash, tenderness, lesion or injury on the right labia. There is no rash, tenderness, lesion or injury on the left labia. Uterus is not tender. Cervix  exhibits no motion tenderness, no discharge and no friability. Right adnexum displays no mass, no tenderness and no fullness. Left adnexum displays no mass, no tenderness and no fullness. No erythema or tenderness in the vagina. No foreign body in the vagina. No signs of injury around the vagina. No vaginal discharge found.  Musculoskeletal: She exhibits no edema or tenderness.  Neurological: She is alert. She has normal strength. No cranial nerve deficit (no facial droop, extraocular movements intact, no slurred speech) or sensory deficit. She exhibits normal muscle tone. She displays no seizure activity. Coordination normal.   Skin: Skin is warm and dry. No rash noted.  Psychiatric: She has a normal mood and affect.  Nursing note and vitals reviewed.    ED Treatments / Results  Labs (all labs ordered are listed, but only abnormal results are displayed) Labs Reviewed  WET PREP, GENITAL - Abnormal; Notable for the following:       Result Value   WBC, Wet Prep HPF POC MODERATE (*)    All other components within normal limits  URINALYSIS, ROUTINE W REFLEX MICROSCOPIC - Abnormal; Notable for the following:    Color, Urine STRAW (*)    All other components within normal limits  LIPASE, BLOOD  COMPREHENSIVE METABOLIC PANEL  CBC  PREGNANCY, URINE  RPR  HIV ANTIBODY (ROUTINE TESTING)  GC/CHLAMYDIA PROBE AMP ([place]) NOT [facility]     Procedures Procedures (including critical care time)  Medications Ordered in ED Medications - No data to display   Initial Impression / Assessment and Plan / ED Course  I have reviewed the triage vital signs and the nursing notes.  Pertinent labs & imaging results that were available during my care of the patient were reviewed by me and considered in my medical decision making (see chart for details).   Pt presents with complaints of lower abdominal pain.   No tenderness on exam.  Labs are reassuring.  Doubt PID, TOA, appendicitis.    May be related to her irregular menses recently.  Will dc home on nsaids.  Follow up with Ob G[facility]  Final Clinical Impressions(s) / ED Diagnoses   Final diagnoses:  Lower abdominal pain    New Prescriptions New Prescriptions   No medications on file     Linwood DibblesJon Rasa Degrazia, MD 01/09/17 1858

## 2017-01-09 NOTE — ED Notes (Signed)
This nurse assisted Dr. Lynelle DoctorKnapp with pelvic exam

## 2017-01-09 NOTE — ED Triage Notes (Signed)
Pt c/o lower abd pain and painful urination. Some nausea. Denies v/d. Denies vaginal bleeding or d/c.

## 2017-01-09 NOTE — Discharge Instructions (Signed)
Take the medications as prescribed, follow up with your primary care doctor next week, return for fever, vomiting, worsening symptoms

## 2017-01-10 LAB — RPR: RPR: NONREACTIVE

## 2017-01-10 LAB — HIV ANTIBODY (ROUTINE TESTING W REFLEX): HIV Screen 4th Generation wRfx: NONREACTIVE

## 2017-01-11 LAB — GC/CHLAMYDIA PROBE AMP (~~LOC~~) NOT AT ARMC
Chlamydia: NEGATIVE
Neisseria Gonorrhea: NEGATIVE

## 2017-01-16 ENCOUNTER — Ambulatory Visit: Payer: PRIVATE HEALTH INSURANCE | Admitting: Women's Health

## 2017-02-13 ENCOUNTER — Ambulatory Visit (INDEPENDENT_AMBULATORY_CARE_PROVIDER_SITE_OTHER): Payer: 59 | Admitting: Women's Health

## 2017-02-13 ENCOUNTER — Encounter: Payer: Self-pay | Admitting: Women's Health

## 2017-02-13 ENCOUNTER — Other Ambulatory Visit: Payer: Self-pay | Admitting: Women's Health

## 2017-02-13 VITALS — BP 110/60 | HR 84 | Ht 63.0 in | Wt 197.0 lb

## 2017-02-13 DIAGNOSIS — N941 Unspecified dyspareunia: Secondary | ICD-10-CM | POA: Diagnosis not present

## 2017-02-13 DIAGNOSIS — R102 Pelvic and perineal pain: Secondary | ICD-10-CM

## 2017-02-13 DIAGNOSIS — R309 Painful micturition, unspecified: Secondary | ICD-10-CM

## 2017-02-13 DIAGNOSIS — R300 Dysuria: Secondary | ICD-10-CM

## 2017-02-13 LAB — POCT URINALYSIS DIPSTICK
Blood, UA: NEGATIVE
GLUCOSE UA: NEGATIVE
Ketones, UA: NEGATIVE
LEUKOCYTES UA: NEGATIVE
NITRITE UA: NEGATIVE
Protein, UA: NEGATIVE

## 2017-02-13 LAB — POCT WET PREP (WET MOUNT)
CLUE CELLS WET PREP WHIFF POC: NEGATIVE
Trichomonas Wet Prep HPF POC: ABSENT

## 2017-02-13 NOTE — Patient Instructions (Signed)
Call us when your next period starts to schedule an ultrasound for when your period is off (about 7 days after it starts)

## 2017-02-13 NOTE — Progress Notes (Signed)
   [facility] Visit  Patient name: Megan Lowe MRN [id number]  Date of birth: 1990-10-23  CC & HPI:  Megan Lowe is a 26 y.o. G23P0010 Caucasian female presenting today for report of bilateral pelvic pain x 2 years, worse lately. Was intermittent at first, now seems to be more constant- sharp & shooting and radiates down into bilateral groins. Worse w/ urination, sex, bending, squatting, etc. Denies abnormal vag d/c, itching/odor/irritation. Denies uti s/s, other than dysuria, but states this does not feel like a uti and urine dipstick today is neg.  No LMP recorded. The current method of family planning is none. Last pap 06/15/15 neg  Pertinent History Reviewed:  Medical & Surgical Hx:   Past medical, surgical, family, and social history reviewed in electronic medical record Medications: Reviewed & Updated - see associated section Allergies: Reviewed in electronic medical record  Objective Findings:  Vitals: BP 110/60 (BP Location: Left Arm, Patient Position: Sitting, Cuff Size: Normal)   Pulse 84   Ht  (1.6 m)   Wt 197 lb (89.4 kg)   BMI 34.90 kg/m  Body mass index is 34.9 kg/m.  Physical Examination: General appearance - alert, well appearing, and in no distress Abdomen - + TTP bilateral lower pelvic area Pelvic - normal external genitalia, cx w/ large anterior nabothian cyst (previously seen) ~2cm. Normal d/c w/o odor. Bimanual: mild CMT and mild TTP uterus and bilateral adnexa L>R, no masses felt  Results for orders placed or performed in visit on 02/13/17 (from the past 24 hour(s))  POCT Urinalysis Dipstick   Collection Time: 02/13/17  9:24 AM  Result Value Ref Range   Color, UA     Clarity, UA     Glucose, UA neg    Bilirubin, UA     Ketones, UA neg    Spec Grav, UA  1.010 - 1.025   Blood, UA neg    pH, UA  5.0 - 8.0   Protein, UA neg    Urobilinogen, UA  0.2 or 1.0 E.U./dL   Nitrite, UA neg    Leukocytes, UA Negative Negative  POCT Wet Prep  Mellody [place]rown [place])   Collection Time: 02/13/17 10:09 AM  Result Value Ref Range   Source Wet Prep POC vaginal    WBC, Wet Prep HPF POC many    Bacteria Wet Prep HPF POC None (A) Few   BACTERIA WET PREP MORPHOLOGY POC     Clue Cells Wet Prep HPF POC None None   Clue Cells Wet Prep Whiff POC Negative Whiff    Yeast Wet Prep HPF POC None    KOH Wet Prep POC     Trichomonas Wet Prep HPF POC Absent Absent     Assessment & Plan:  A:   Bilateral lower pelvic pain  Dyspareunia  P:  GC/CT, if neg- will give antibiotics (many wbc's on wet prep)  Call when period starts to schedule u/s when period will be off (and f/u w/ me after)  Return for will call us for pelvic u/s when period starts (u/s when period is off).  Marge Duncans CNM, [facility] 02/13/2017 10:09 AM

## 2017-02-14 LAB — GC/CHLAMYDIA PROBE AMP
CHLAMYDIA, DNA PROBE: NEGATIVE
Neisseria gonorrhoeae by PCR: NEGATIVE

## 2017-02-15 ENCOUNTER — Telehealth: Payer: Self-pay | Admitting: Women's Health

## 2017-02-15 LAB — URINE CULTURE: Organism ID, Bacteria: NO GROWTH

## 2017-02-15 MED ORDER — DOXYCYCLINE HYCLATE 100 MG PO TABS: 100.0000 mg | ORAL_TABLET | Freq: Two times a day (BID) | ORAL | 0 refills | Status: DC

## 2017-02-15 NOTE — Telephone Encounter (Signed)
Pt called stating that she would like a call back from [place], Pt states that she had some blood work done and would like to know the results. Please contact pt

## 2017-02-15 NOTE — Telephone Encounter (Signed)
Called pt, notified gc/ct neg, rx doxycycline for cervicitis.  Cheral MarkerKimberly R. Ariya Bohannon, CNM, [facility] 02/15/2017 8:45 AM

## 2017-02-20 ENCOUNTER — Other Ambulatory Visit: Payer: Self-pay | Admitting: Women's Health

## 2017-02-20 DIAGNOSIS — R102 Pelvic and perineal pain: Secondary | ICD-10-CM

## 2017-02-20 DIAGNOSIS — N941 Unspecified dyspareunia: Secondary | ICD-10-CM

## 2017-02-20 DIAGNOSIS — R300 Dysuria: Secondary | ICD-10-CM

## 2017-02-22 ENCOUNTER — Ambulatory Visit (INDEPENDENT_AMBULATORY_CARE_PROVIDER_SITE_OTHER): Payer: 59

## 2017-02-22 ENCOUNTER — Ambulatory Visit (INDEPENDENT_AMBULATORY_CARE_PROVIDER_SITE_OTHER): Payer: 59 | Admitting: Women's Health

## 2017-02-22 ENCOUNTER — Encounter: Payer: Self-pay | Admitting: Women's Health

## 2017-02-22 VITALS — BP 118/76 | HR 77 | Wt 197.0 lb

## 2017-02-22 DIAGNOSIS — R300 Dysuria: Secondary | ICD-10-CM

## 2017-02-22 DIAGNOSIS — R102 Pelvic and perineal pain: Secondary | ICD-10-CM

## 2017-02-22 DIAGNOSIS — N941 Unspecified dyspareunia: Secondary | ICD-10-CM | POA: Diagnosis not present

## 2017-02-22 NOTE — Progress Notes (Signed)
   [facility] Visit  Patient name: Megan Lowe MRN [id number]  Date of birth: 10/16/1990  CC & HPI:  Megan Lowe is a 26 y.o. 231P0010 Caucasian female presenting today for f/u after pelvic u/s done for bilateral pelvic pain x 2 years, worse lately.  Was intermittent at first, now seems to be more constant- sharp & shooting and radiates down into bilateral groins. Worse w/ urination, sex, bending, squatting, etc. Stopped coc's couple of months ago- that's when pain became more constant rather than intermittent. Gets married 04/21/17, wants to try for pregnancy. Not taking pnv.  Patient's last menstrual period was 02/16/2017 (exact date). The current method of family planning is none. Last pap 06/15/15 neg  Pertinent History Reviewed:  Medical & Surgical Hx:   Past medical, surgical, family, and social history reviewed in electronic medical record Medications: Reviewed & Updated - see associated section Allergies: Reviewed in electronic medical record  Objective Findings:  Vitals: BP 118/76 (BP Location: Right Arm, Patient Position: Sitting, Cuff Size: Normal)   Pulse 77   Wt 197 lb (89.4 kg)   LMP 02/16/2017 (Exact Date)   BMI 34.90 kg/m  Body mass index is 34.9 kg/m.  Physical Examination: General appearance - alert, well appearing, and in no distress  Today's Pelvic U/S:  Megan Lowe is a 26 y.o. G1P0010 LMP 02/26/2017 for a pelvic sonogram for bilat pelvic pain.  Uterus                      6.3 x 3.2 x 4.2 cm, homogeneous anteverted uterus,wnl  Endometrium          4.4 mm, symmetrical, wnl  Right ovary             2.7 x 1.6 x 2.3 cm, wnl  Left ovary                3.2 x 1.4 x 2.1 cm, wnl  Simple nabothian cyst 1.4 x 1 x 1.8 cm  Technician Comments:  PELVIC US TA/TV: homogeneous anteverted uterus,wnl,normal ovaries bilat.,simple nabothian cyst 1.4 x 1 x 1.8 cm, EEC 4.4 mm,no free fluid,ovaries appear mobile,no pain during  ultrasound   [employer] Carl 02/22/2017 No results found for this or any previous visit (from the past 24 hour(s)).   Assessment & Plan:  A:   Bilateral pelvic pain  Trying to conceive  P:  No ultrasonographic etiology for her pain, discussed could potentially be r/t endometriosis, esp since worsened when stopped coc's- discussed this is can only be dx w/ surgery  Recommended NSAIDs right now for pain relief  Begin pnv daily  Let us know when pregnant  Return in about 1 year (around 02/22/2018) for Pap & physical.  Marge DuncansBooker, Jaymar Loeber Randall CNM, [facility]BC 02/22/2017 11:26 AM

## 2017-02-22 NOTE — Patient Instructions (Signed)
Start taking prenatal vitamin daily, please make sure it has at least 400mcg of folic acid  Let us know when you're pregnant

## 2017-02-22 NOTE — Progress Notes (Addendum)
PELVIC US TA/TV: homogeneous anteverted uterus,wnl,normal ovaries bilat,EEC 4.4 mm,simple nabothian cyst 1.4 x 1 x 1.8 cm no free fluid,ovaries appear mobile,no pain during ultrasound

## 2017-03-07 ENCOUNTER — Telehealth: Payer: Self-pay | Admitting: *Deleted

## 2017-03-07 NOTE — Telephone Encounter (Signed)
Patient called stating she is still in pain daily even after taking Tylenol and Motrin. She doesn't know what else to do. Please advise.

## 2017-03-07 NOTE — Telephone Encounter (Signed)
Left message I called 

## 2017-03-08 NOTE — Telephone Encounter (Signed)
Left message I called, and wil be out of office til 6/4 if needs anything can call and speak with Selena BattenKim

## 2017-03-19 ENCOUNTER — Telehealth: Payer: Self-pay | Admitting: Women's Health

## 2017-03-19 ENCOUNTER — Other Ambulatory Visit: Payer: Self-pay | Admitting: Women's Health

## 2017-03-19 DIAGNOSIS — N926 Irregular menstruation, unspecified: Secondary | ICD-10-CM

## 2017-03-19 DIAGNOSIS — R110 Nausea: Secondary | ICD-10-CM

## 2017-03-19 DIAGNOSIS — Z3202 Encounter for pregnancy test, result negative: Secondary | ICD-10-CM

## 2017-03-19 DIAGNOSIS — N644 Mastodynia: Secondary | ICD-10-CM

## 2017-03-19 NOTE — Telephone Encounter (Signed)
Returned pt's call. LM on voicemail. Also called number she called me at from work, was on-hold for prolonged period. Pt to call me back.  Cheral MarkerKimberly R. Aracelie Addis, CNM, [facility] 03/19/2017 4:36 PM

## 2017-03-19 NOTE — Progress Notes (Signed)
Pt returned call. States for past 2wks she has been waking up in middle of night w/ nausea, cramping, breast tenderness. Has had 3 neg HPT. Wants to do bhcg. Order placed. Can call Wed am for results.  Cheral MarkerKimberly R. Pepper Wyndham, CNM, [facility]NP[facility]BC 03/19/2017 4:41 PM

## 2017-03-21 LAB — BETA HCG QUANT (REF LAB)

## 2017-03-22 ENCOUNTER — Telehealth: Payer: Self-pay | Admitting: Women's Health

## 2017-03-22 NOTE — Telephone Encounter (Signed)
LM bhcg neg.  Cheral MarkerKimberly R. Atia Lowe, CNM, [facility] 03/22/2017 3:36 PM

## 2017-04-30 ENCOUNTER — Telehealth: Payer: Self-pay | Admitting: *Deleted

## 2017-04-30 NOTE — Telephone Encounter (Signed)
Discussed with Victorino DikeJennifer and pt's appointment moved up.  04-30-17  AS

## 2017-05-01 ENCOUNTER — Encounter: Payer: Self-pay | Admitting: Adult Health

## 2017-05-01 ENCOUNTER — Ambulatory Visit (INDEPENDENT_AMBULATORY_CARE_PROVIDER_SITE_OTHER): Payer: 59 | Admitting: Adult Health

## 2017-05-01 VITALS — BP 100/70 | HR 88 | Ht 63.0 in | Wt 203.0 lb

## 2017-05-01 DIAGNOSIS — Z8742 Personal history of other diseases of the female genital tract: Secondary | ICD-10-CM | POA: Insufficient documentation

## 2017-05-01 DIAGNOSIS — Z331 Pregnant state, incidental: Secondary | ICD-10-CM | POA: Diagnosis not present

## 2017-05-01 DIAGNOSIS — Z6791 Unspecified blood type, Rh negative: Secondary | ICD-10-CM | POA: Diagnosis not present

## 2017-05-01 DIAGNOSIS — Z3201 Encounter for pregnancy test, result positive: Secondary | ICD-10-CM | POA: Insufficient documentation

## 2017-05-01 DIAGNOSIS — O3680X0 Pregnancy with inconclusive fetal viability, not applicable or unspecified: Secondary | ICD-10-CM

## 2017-05-01 DIAGNOSIS — O26899 Other specified pregnancy related conditions, unspecified trimester: Secondary | ICD-10-CM | POA: Insufficient documentation

## 2017-05-01 DIAGNOSIS — O26891 Other specified pregnancy related conditions, first trimester: Secondary | ICD-10-CM

## 2017-05-01 DIAGNOSIS — Z3A01 Less than 8 weeks gestation of pregnancy: Secondary | ICD-10-CM | POA: Insufficient documentation

## 2017-05-01 DIAGNOSIS — N926 Irregular menstruation, unspecified: Secondary | ICD-10-CM

## 2017-05-01 DIAGNOSIS — Z0000 Encounter for general adult medical examination without abnormal findings: Secondary | ICD-10-CM | POA: Insufficient documentation

## 2017-05-01 LAB — POCT URINE PREGNANCY: Preg Test, Ur: POSITIVE — AB

## 2017-05-01 MED ORDER — RHO D IMMUNE GLOBULIN 1500 UNIT/2ML IJ SOSY
300.0000 ug | PREFILLED_SYRINGE | Freq: Once | INTRAMUSCULAR | Status: AC
  Administered 2017-05-01: 300 ug via INTRAMUSCULAR

## 2017-05-01 NOTE — Patient Instructions (Signed)
First Trimester of Pregnancy The first trimester of pregnancy is from week 1 until the end of week 13 (months 1 through 3). A week after a sperm fertilizes an egg, the egg will implant on the wall of the uterus. This embryo will begin to develop into a baby. Genes from you and your partner will form the baby. The female genes will determine whether the baby will be a boy or a girl. At 6-8 weeks, the eyes and face will be formed, and the heartbeat can be seen on ultrasound. At the end of 12 weeks, all the baby's organs will be formed. Now that you are pregnant, you will want to do everything you can to have a healthy baby. Two of the most important things are to get good prenatal care and to follow your health care provider's instructions. Prenatal care is all the medical care you receive before the baby's birth. This care will help prevent, find, and treat any problems during the pregnancy and childbirth. Body changes during your first trimester Your body goes through many changes during pregnancy. The changes vary from woman to woman.  You may gain or lose a couple of pounds at first.  You may feel sick to your stomach (nauseous) and you may throw up (vomit). If the vomiting is uncontrollable, call your health care provider.  You may tire easily.  You may develop headaches that can be relieved by medicines. All medicines should be approved by your health care provider.  You may urinate more often. Painful urination may mean you have a bladder infection.  You may develop heartburn as a result of your pregnancy.  You may develop constipation because certain hormones are causing the muscles that push stool through your intestines to slow down.  You may develop hemorrhoids or swollen veins (varicose veins).  Your breasts may begin to grow larger and become tender. Your nipples may stick out more, and the tissue that surrounds them (areola) may become darker.  Your gums may bleed and may be  sensitive to brushing and flossing.  Dark spots or blotches (chloasma, mask of pregnancy) may develop on your face. This will likely fade after the baby is born.  Your menstrual periods will stop.  You may have a loss of appetite.  You may develop cravings for certain kinds of food.  You may have changes in your emotions from day to day, such as being excited to be pregnant or being concerned that something may go wrong with the pregnancy and baby.  You may have more vivid and strange dreams.  You may have changes in your hair. These can include thickening of your hair, rapid growth, and changes in texture. Some women also have hair loss during or after pregnancy, or hair that feels dry or thin. Your hair will most likely return to normal after your baby is born.  What to expect at prenatal visits During a routine prenatal visit:  You will be weighed to make sure you and the baby are growing normally.  Your blood pressure will be taken.  Your abdomen will be measured to track your baby's growth.  The fetal heartbeat will be listened to between weeks 10 and 14 of your pregnancy.  Test results from any previous visits will be discussed.  Your health care provider may ask you:  How you are feeling.  If you are feeling the baby move.  If you have had any abnormal symptoms, such as leaking fluid, bleeding, severe headaches,   or abdominal cramping.  If you are using any tobacco products, including cigarettes, chewing tobacco, and electronic cigarettes.  If you have any questions.  Other tests that may be performed during your first trimester include:  Blood tests to find your blood type and to check for the presence of any previous infections. The tests will also be used to check for low iron levels (anemia) and protein on red blood cells (Rh antibodies). Depending on your risk factors, or if you previously had diabetes during pregnancy, you may have tests to check for high blood  sugar that affects pregnant women (gestational diabetes).  Urine tests to check for infections, diabetes, or protein in the urine.  An ultrasound to confirm the proper growth and development of the baby.  Fetal screens for spinal cord problems (spina bifida) and Down syndrome.  HIV (human immunodeficiency virus) testing. Routine prenatal testing includes screening for HIV, unless you choose not to have this test.  You may need other tests to make sure you and the baby are doing well.  Follow these instructions at home: Medicines  Follow your health care provider's instructions regarding medicine use. Specific medicines may be either safe or unsafe to take during pregnancy.  Take a prenatal vitamin that contains at least 600 micrograms (mcg) of folic acid.  If you develop constipation, try taking a stool softener if your health care provider approves. Eating and drinking  Eat a balanced diet that includes fresh fruits and vegetables, whole grains, good sources of protein such as meat, eggs, or tofu, and low-fat dairy. Your health care provider will help you determine the amount of weight gain that is right for you.  Avoid raw meat and uncooked cheese. These carry germs that can cause birth defects in the baby.  Eating four or five small meals rather than three large meals a day may help relieve nausea and vomiting. If you start to feel nauseous, eating a few soda crackers can be helpful. Drinking liquids between meals, instead of during meals, also seems to help ease nausea and vomiting.  Limit foods that are high in fat and processed sugars, such as fried and sweet foods.  To prevent constipation: ? Eat foods that are high in fiber, such as fresh fruits and vegetables, whole grains, and beans. ? Drink enough fluid to keep your urine clear or pale yellow. Activity  Exercise only as directed by your health care provider. Most women can continue their usual exercise routine during  pregnancy. Try to exercise for 30 minutes at least 5 days a week. Exercising will help you: ? Control your weight. ? Stay in shape. ? Be prepared for labor and delivery.  Experiencing pain or cramping in the lower abdomen or lower back is a good sign that you should stop exercising. Check with your health care provider before continuing with normal exercises.  Try to avoid standing for long periods of time. Move your legs often if you must stand in one place for a long time.  Avoid heavy lifting.  Wear low-heeled shoes and practice good posture.  You may continue to have sex unless your health care provider tells you not to. Relieving pain and discomfort  Wear a good support bra to relieve breast tenderness.  Take warm sitz baths to soothe any pain or discomfort caused by hemorrhoids. Use hemorrhoid cream if your health care provider approves.  Rest with your legs elevated if you have leg cramps or low back pain.  If you develop   varicose veins in your legs, wear support hose. Elevate your feet for 15 minutes, 3-4 times a day. Limit salt in your diet. Prenatal care  Schedule your prenatal visits by the twelfth week of pregnancy. They are usually scheduled monthly at first, then more often in the last 2 months before delivery.  Write down your questions. Take them to your prenatal visits.  Keep all your prenatal visits as told by your health care provider. This is important. Safety  Wear your seat belt at all times when driving.  Make a list of emergency phone numbers, including numbers for family, friends, the hospital, and police and fire departments. General instructions  Ask your health care provider for a referral to a local prenatal education class. Begin classes no later than the beginning of month 6 of your pregnancy.  Ask for help if you have counseling or nutritional needs during pregnancy. Your health care provider can offer advice or refer you to specialists for help  with various needs.  Do not use hot tubs, steam rooms, or saunas.  Do not douche or use tampons or scented sanitary pads.  Do not cross your legs for long periods of time.  Avoid cat litter boxes and soil used by cats. These carry germs that can cause birth defects in the baby and possibly loss of the fetus by miscarriage or stillbirth.  Avoid all smoking, herbs, alcohol, and medicines not prescribed by your health care provider. Chemicals in these products affect the formation and growth of the baby.  Do not use any products that contain nicotine or tobacco, such as cigarettes and e-cigarettes. If you need help quitting, ask your health care provider. You may receive counseling support and other resources to help you quit.  Schedule a dentist appointment. At home, brush your teeth with a soft toothbrush and be gentle when you floss. Contact a health care provider if:  You have dizziness.  You have mild pelvic cramps, pelvic pressure, or nagging pain in the abdominal area.  You have persistent nausea, vomiting, or diarrhea.  You have a bad smelling vaginal discharge.  You have pain when you urinate.  You notice increased swelling in your face, hands, legs, or ankles.  You are exposed to fifth disease or chickenpox.  You are exposed to German measles (rubella) and have never had it. Get help right away if:  You have a fever.  You are leaking fluid from your vagina.  You have spotting or bleeding from your vagina.  You have severe abdominal cramping or pain.  You have rapid weight gain or loss.  You vomit blood or material that looks like coffee grounds.  You develop a severe headache.  You have shortness of breath.  You have any kind of trauma, such as from a fall or a car accident. Summary  The first trimester of pregnancy is from week 1 until the end of week 13 (months 1 through 3).  Your body goes through many changes during pregnancy. The changes vary from  woman to woman.  You will have routine prenatal visits. During those visits, your health care provider will examine you, discuss any test results you may have, and talk with you about how you are feeling. This information is not intended to replace advice given to you by your health care provider. Make sure you discuss any questions you have with your health care provider. Document Released: 09/26/2001 Document Revised: 09/13/2016 Document Reviewed: 09/13/2016 Elsevier Interactive Patient Education  2017 Elsevier   Inc.  

## 2017-05-01 NOTE — Progress Notes (Signed)
Subjective:     Patient ID: Megan Lowe, female   DOB: 12/26/1990, 26 y.o.   MRN: [id number]  HPI Megan Lowe is a 26 year old white female, recently married 04/21/17 in for UPT, has missed a period and had 6+HPT, then had spotting 7/7 for about 3-4 days, none now.   Review of Systems Missed period with 6+HPT Had bleeding 7/7 for about 3-4 days, none now Reviewed past medical,surgical, social and family history. Reviewed medications and allergies.     Objective:   Physical Exam BP 100/70 (BP Location: Left Arm, Patient Position: Sitting, Cuff Size: Large)   Pulse 88   Ht 5\' 3"  (1.6 m)   Wt 203 lb (92.1 kg)   LMP 03/20/2017 (Approximate)   BMI 35.96 kg/m UPT +, about 6 weeks by LMP, with EDD 3/10/18/17,Skin warm and dry. Neck: mid line trachea, normal thyroid, good ROM, no lymphadenopathy noted. Lungs: clear to ausculation bilaterally. Cardiovascular: regular rate and rhythm.Abdomen is soft and mildly tender. PHQ 2 score 0. She says her blood type is O- and husband's is O+, will give rhophylac now,and will check QHCG and progesterone level.Pt aware of after hours call service and [facility].    Will get US next week.  Assessment:     1. Pregnancy test positive   2. Less than [redacted] weeks gestation of pregnancy   3. Encounter to determine fetal viability of pregnancy, single or unspecified fetus   4. History of vaginal bleeding   5. Rh negative status during pregnancy in first trimester        Plan:     Check QHCG and progesterone level now Get rhophylac now Return in about 1 week for dating US Review handout on first trimester and [facility]

## 2017-05-02 ENCOUNTER — Telehealth: Payer: Self-pay | Admitting: *Deleted

## 2017-05-02 DIAGNOSIS — Z3490 Encounter for supervision of normal pregnancy, unspecified, unspecified trimester: Secondary | ICD-10-CM

## 2017-05-02 LAB — BETA HCG QUANT (REF LAB): HCG QUANT: 211 m[IU]/mL

## 2017-05-02 LAB — PROGESTERONE: Progesterone: 0.5 ng/mL

## 2017-05-02 NOTE — Telephone Encounter (Signed)
Pt aware of labs and that progesterone really low, [facility]

## 2017-05-04 ENCOUNTER — Telehealth: Payer: Self-pay | Admitting: Adult Health

## 2017-05-04 LAB — BETA HCG QUANT (REF LAB): hCG Quant: 297 m[IU]/mL

## 2017-05-04 NOTE — Telephone Encounter (Signed)
Patient called requesting Hcg result. Informed patient results were not back.

## 2017-05-04 NOTE — Telephone Encounter (Signed)
Patient called stating that she would like a call back from Jennifer, Patient did not state the reason why. Please contact pt °

## 2017-05-07 ENCOUNTER — Emergency Department (HOSPITAL_COMMUNITY)
Admission: EM | Admit: 2017-05-07 | Discharge: 2017-05-07 | Disposition: A | Discharge status: Home / Self Care | Payer: PRIVATE HEALTH INSURANCE | Attending: Emergency Medicine | Admitting: Emergency Medicine

## 2017-05-07 ENCOUNTER — Telehealth: Payer: Self-pay | Admitting: Adult Health

## 2017-05-07 ENCOUNTER — Emergency Department (HOSPITAL_COMMUNITY): Payer: PRIVATE HEALTH INSURANCE

## 2017-05-07 ENCOUNTER — Encounter (HOSPITAL_COMMUNITY): Payer: Self-pay

## 2017-05-07 DIAGNOSIS — O26891 Other specified pregnancy related conditions, first trimester: Secondary | ICD-10-CM

## 2017-05-07 DIAGNOSIS — O26899 Other specified pregnancy related conditions, unspecified trimester: Secondary | ICD-10-CM

## 2017-05-07 DIAGNOSIS — R102 Pelvic and perineal pain: Secondary | ICD-10-CM | POA: Insufficient documentation

## 2017-05-07 DIAGNOSIS — O9989 Other specified diseases and conditions complicating pregnancy, childbirth and the puerperium: Secondary | ICD-10-CM | POA: Insufficient documentation

## 2017-05-07 DIAGNOSIS — Z3A01 Less than 8 weeks gestation of pregnancy: Secondary | ICD-10-CM | POA: Insufficient documentation

## 2017-05-07 LAB — WET PREP, GENITAL
CLUE CELLS WET PREP: NONE SEEN
SPERM: NONE SEEN
TRICH WET PREP: NONE SEEN
Yeast Wet Prep HPF POC: NONE SEEN

## 2017-05-07 LAB — URINALYSIS, ROUTINE W REFLEX MICROSCOPIC
Bilirubin Urine: NEGATIVE
Glucose, UA: NEGATIVE mg/dL
Hgb urine dipstick: NEGATIVE
Ketones, ur: NEGATIVE mg/dL
LEUKOCYTES UA: NEGATIVE
NITRITE: NEGATIVE
PH: 7 (ref 5.0–8.0)
Protein, ur: NEGATIVE mg/dL
SPECIFIC GRAVITY, URINE: 1.02 (ref 1.005–1.030)

## 2017-05-07 LAB — I-STAT BETA HCG BLOOD, ED (MC, WL, AP ONLY): I-stat hCG, quantitative: 805.3 m[IU]/mL — ABNORMAL HIGH (ref <5)

## 2017-05-07 LAB — CBC
HEMATOCRIT: 36.3 % (ref 36.0–46.0)
Hemoglobin: 12.3 g/dL (ref 12.0–15.0)
MCH: 28.3 pg (ref 26.0–34.0)
MCHC: 33.9 g/dL (ref 30.0–36.0)
MCV: 83.4 fL (ref 78.0–100.0)
Platelets: 253 10*3/uL (ref 150–400)
RBC: 4.35 MIL/uL (ref 3.87–5.11)
RDW: 12.9 % (ref 11.5–15.5)
WBC: 12.7 10*3/uL — AB (ref 4.0–10.5)

## 2017-05-07 LAB — I-STAT CHEM 8, ED
BUN: 9 mg/dL (ref 6–20)
CHLORIDE: 104 mmol/L (ref 101–111)
CREATININE: 0.7 mg/dL (ref 0.44–1.00)
Calcium, Ion: 1.03 mmol/L — ABNORMAL LOW (ref 1.15–1.40)
Glucose, Bld: 90 mg/dL (ref 65–99)
HEMATOCRIT: 36 % (ref 36.0–46.0)
HEMOGLOBIN: 12.2 g/dL (ref 12.0–15.0)
POTASSIUM: 3.9 mmol/L (ref 3.5–5.1)
Sodium: 137 mmol/L (ref 135–145)
TCO2: 26 mmol/L (ref 0–100)

## 2017-05-07 LAB — ABO/RH: ABO/RH(D): O NEG

## 2017-05-07 NOTE — Telephone Encounter (Signed)
[facility] called saying Megan Lowe has severe pain LLQ,told to go to [facility] now.

## 2017-05-07 NOTE — Telephone Encounter (Signed)
Patient called stating that she would like a call back from [place] regarding her HCQ levels, Pt states that she was suppose to get a [phoe call on Friday last week. Please contact pt

## 2017-05-07 NOTE — Discharge Instructions (Signed)
[facility]   Return if increasing pain or problems

## 2017-05-07 NOTE — ED Provider Notes (Signed)
[facility]Y DEPT Provider Note   CSN: [id number] Arrival date & time: 05/07/17  1620     History   Chief Complaint Chief Complaint  Patient presents with  . Abdominal Pain    HPI Megan Lowe is a 26 y.o. female.  The history is provided by the patient. No language interpreter was used.  Abdominal Pain   This is a new problem. The current episode started 6 to 12 hours ago. The problem occurs constantly. The problem has been gradually improving. The quality of the pain is aching. The pain is moderate. Nothing aggravates the symptoms. Nothing relieves the symptoms.  Pt had a positive pregnancy test 2 weeks ago.  Pt seen at [facility] and had a qhcg on 7/17 of 211. Pt had severe abdominal pain today.  Pt called office and was advised to come here.  Pt was given rhogam on 7/19.   Past Medical History:  Diagnosis Date  . Contraceptive management 07/28/2013  . Dyspareunia 11/04/2014  . Vaginal discharge 11/04/2014  . Yeast infection 11/04/2014    Patient Active Problem List   Diagnosis Date Noted  . Rh negative status during pregnancy in first trimester 05/01/2017  . History of vaginal bleeding 05/01/2017  . Encounter to determine fetal viability of pregnancy 05/01/2017  . Less than [redacted] weeks gestation of pregnancy 05/01/2017  . Pregnancy test positive 05/01/2017  . Nabothian cyst 12/15/2015  . Yeast infection 11/04/2014  . Dyspareunia 11/04/2014    History reviewed. No pertinent surgical history.  OB History    Gravida Para Term Preterm AB Living   2 0 0 0 1 0   SAB TAB Ectopic Multiple Live Births   0 0 0 0         Home Medications    Prior to Admission medications   Medication Sig Start Date End Date Taking? Authorizing Provider  Prenatal Vit-Fe Fumarate-FA (MULTIVITAMIN-PRENATAL) 27-0.8 MG TABS tablet Take 1 tablet by mouth daily at 12 noon.    [provider]    Family History Family History  Problem Relation Age of Onset  . Hypertension  Mother   . Cancer Paternal Grandfather        skin   . Anxiety disorder Sister   . Depression Sister   . Pulmonary embolism Sister   . Other Paternal Grandmother        wears a heart monitor  . Hypertension Maternal Grandmother     Social History Social History  Substance Use Topics  . Smoking status: Never Smoker  . Smokeless tobacco: Never Used  . Alcohol use No     Allergies   Patient has no known allergies.   Review of Systems Review of Systems  Gastrointestinal: Positive for abdominal pain.  All other systems reviewed and are negative.    Physical Exam Updated Vital Signs BP 126/77 (BP Location: Right Arm)   Pulse 74   Temp 98.2 F (36.8 C) (Oral)   Resp 18   Ht 5\' 2"  (1.575 m)   Wt 81.6 kg (180 lb)   LMP 03/20/2017 (Approximate)   SpO2 100%   BMI 32.92 kg/m   Physical Exam  Constitutional: She appears well-developed and well-nourished. No distress.  HENT:  Head: Normocephalic and atraumatic.  Eyes: Conjunctivae are normal.  Neck: Neck supple.  Cardiovascular: Normal rate and regular rhythm.   No murmur heard. Pulmonary/Chest: Effort normal and breath sounds normal. No respiratory distress.  Abdominal: Soft. There is no tenderness.  Genitourinary: Vagina normal.  Genitourinary Comments: Scant white discahrge no bleeding  Musculoskeletal: She exhibits no edema.  Neurological: She is alert.  Skin: Skin is warm and dry.  Psychiatric: She has a normal mood and affect.  Nursing note and vitals reviewed.    ED Treatments / Results  Labs (all labs ordered are listed, but only abnormal results are displayed) Labs Reviewed  CBC - Abnormal; Notable for the following:       Result Value   WBC 12.7 (*)    All other components within normal limits  I-STAT CHEM 8, ED - Abnormal; Notable for the following:    Calcium, Ion 1.03 (*)    All other components within normal limits  I-STAT BETA HCG BLOOD, ED ([facility], [facility], [facility] ONLY) - Abnormal; Notable for the  following:    I-stat hCG, quantitative 805.3 (*)    All other components within normal limits  URINE CULTURE  URINALYSIS, ROUTINE W REFLEX MICROSCOPIC  ABO/RH    EKG  EKG Interpretation None       Radiology US Ob Comp Less 14 Wks  Result Date: 05/07/2017 CLINICAL DATA:  Acute onset left lower quadrant pain. Patient is pregnant, 6 weeks and 6 days based on LMP. EXAM: OBSTETRIC <14 WK [place] AND TRANSVAGINAL OB US TECHNIQUE: Both transabdominal and transvaginal ultrasound examinations were performed for complete evaluation of the gestation as well as the maternal uterus, adnexal regions, and pelvic cul-de-sac. Transvaginal technique was performed to assess early pregnancy. COMPARISON:  Pelvic ultrasound dated May 03, 2017. FINDINGS: Intrauterine gestational sac: None Yolk sac:  Not Visualized. Embryo:  Not Visualized. Cardiac Activity: Not Visualized. Maternal uterus/adnexae: The uterus measures 7.5 x 3.3 x 4.5 cm. Prominent nabothian cyst measuring up to 13 mm is unchanged. The endometrium measures 1.2 cm. Heterogeneous, complex right ovarian cyst measuring 3.8 x 3.0 x 2.8 cm with reticular pattern of internal echoes and no internal vascularity. The left ovary is normal. Normal arterial and venous flow to both ovaries. No free fluid in the pelvis. IMPRESSION: 1. No evidence of intrauterine or ectopic pregnancy. Given estimated gestational age by LMP and failure of beta HCG to double, findings are suspicious but not yet definitive for failed pregnancy. Recommend follow-up US in 6-10 days for definitive diagnosis. This recommendation follows SRU consensus guidelines: Diagnostic Criteria for Nonviable Pregnancy Early in the First Trimester. Malva Limes Med 2013; 161:0960-45. 2. Right ovarian hemorrhagic cyst measuring 3.8 cm. 3. No sonographic evidence of ovarian torsion. Electronically Signed   By: Obie Dredge M.D.   On: 05/07/2017 17:50   US Ob Transvaginal  Result Date: 05/07/2017 CLINICAL DATA:   Acute onset left lower quadrant pain. Patient is pregnant, 6 weeks and 6 days based on LMP. EXAM: OBSTETRIC <14 WK [place] AND TRANSVAGINAL OB US TECHNIQUE: Both transabdominal and transvaginal ultrasound examinations were performed for complete evaluation of the gestation as well as the maternal uterus, adnexal regions, and pelvic cul-de-sac. Transvaginal technique was performed to assess early pregnancy. COMPARISON:  Pelvic ultrasound dated May 03, 2017. FINDINGS: Intrauterine gestational sac: None Yolk sac:  Not Visualized. Embryo:  Not Visualized. Cardiac Activity: Not Visualized. Maternal uterus/adnexae: The uterus measures 7.5 x 3.3 x 4.5 cm. Prominent nabothian cyst measuring up to 13 mm is unchanged. The endometrium measures 1.2 cm. Heterogeneous, complex right ovarian cyst measuring 3.8 x 3.0 x 2.8 cm with reticular pattern of internal echoes and no internal vascularity. The left ovary is normal. Normal arterial and venous flow to both ovaries. No free fluid  in the pelvis. IMPRESSION: 1. No evidence of intrauterine or ectopic pregnancy. Given estimated gestational age by LMP and failure of beta HCG to double, findings are suspicious but not yet definitive for failed pregnancy. Recommend follow-up US in 6-10 days for definitive diagnosis. This recommendation follows SRU consensus guidelines: Diagnostic Criteria for Nonviable Pregnancy Early in the First Trimester. Malva Limes Engl J Med 2013; 098:1191-47; 369:1443-51. 2. Right ovarian hemorrhagic cyst measuring 3.8 cm. 3. No sonographic evidence of ovarian torsion. Electronically Signed   By: Obie DredgeWilliam T Derry M.D.   On: 05/07/2017 17:50   [place] Art/ven Flow Abd Pelv Doppler  Result Date: 05/07/2017 CLINICAL DATA:  Acute onset left lower quadrant pain. Patient is pregnant, 6 weeks and 6 days based on LMP. EXAM: OBSTETRIC <14 WK US AND TRANSVAGINAL OB US TECHNIQUE: Both transabdominal and transvaginal ultrasound examinations were performed for complete evaluation of the gestation as  well as the maternal uterus, adnexal regions, and pelvic cul-de-sac. Transvaginal technique was performed to assess early pregnancy. COMPARISON:  Pelvic ultrasound dated May 03, 2017. FINDINGS: Intrauterine gestational sac: None Yolk sac:  Not Visualized. Embryo:  Not Visualized. Cardiac Activity: Not Visualized. Maternal uterus/adnexae: The uterus measures 7.5 x 3.3 x 4.5 cm. Prominent nabothian cyst measuring up to 13 mm is unchanged. The endometrium measures 1.2 cm. Heterogeneous, complex right ovarian cyst measuring 3.8 x 3.0 x 2.8 cm with reticular pattern of internal echoes and no internal vascularity. The left ovary is normal. Normal arterial and venous flow to both ovaries. No free fluid in the pelvis. IMPRESSION: 1. No evidence of intrauterine or ectopic pregnancy. Given estimated gestational age by LMP and failure of beta HCG to double, findings are suspicious but not yet definitive for failed pregnancy. Recommend follow-up US in 6-10 days for definitive diagnosis. This recommendation follows SRU consensus guidelines: Diagnostic Criteria for Nonviable Pregnancy Early in the First Trimester. Malva Limes Engl J Med 2013; 829:5621-30; 369:1443-51. 2. Right ovarian hemorrhagic cyst measuring 3.8 cm. 3. No sonographic evidence of ovarian torsion. Electronically Signed   By: Obie DredgeWilliam T Derry M.D.   On: 05/07/2017 17:50    Procedures Procedures (including critical care time)  Medications Ordered in ED Medications - No data to display   Initial Impression / Assessment and Plan / ED Course  I have reviewed the triage vital signs and the nursing notes.  Pertinent labs & imaging results that were available during my care of the patient were reviewed by me and considered in my medical decision making (see chart for details).     I spoke to Dr. Despina HiddenEure.  He advised pt needs follow up in his office on Thursday.   I discussed ectopic precautions with pt.    Final Clinical Impressions(s) / ED Diagnoses   Final diagnoses:    Pelvic pain in pregnancy    New Prescriptions New Prescriptions   No medications on file  An After Visit Summary was printed and given to the patient.   Elson AreasSofia, Hanin Decook K, PA-C 05/07/17 2215    Samuel JesterMcManus, Kathleen, DO 05/11/17 [phone]

## 2017-05-07 NOTE — Telephone Encounter (Signed)
Pt aware QHCG not doubling and that progesterone 0.5, supplementation will not help, is probable chemical pregnancy per Dr Despina HiddenEure , will get US 7/25 as scheduled, [facility], [facility] says [facility] for dehydration and QHCG was 311 Friday.

## 2017-05-07 NOTE — ED Triage Notes (Signed)
Reports of sudden onset of LLQ abdominal pain x2 hours. Patient is pregnant but not sure how far long.

## 2017-05-08 ENCOUNTER — Other Ambulatory Visit: Payer: Self-pay | Admitting: Obstetrics & Gynecology

## 2017-05-08 MED ORDER — HYDROCODONE-ACETAMINOPHEN 5-325 MG PO TABS: 1.0000 | ORAL_TABLET | Freq: Four times a day (QID) | ORAL | 0 refills | Status: DC | PRN

## 2017-05-09 ENCOUNTER — Ambulatory Visit: Payer: 59 | Admitting: Adult Health

## 2017-05-09 ENCOUNTER — Other Ambulatory Visit: Payer: Self-pay | Admitting: Adult Health

## 2017-05-09 ENCOUNTER — Other Ambulatory Visit: Payer: 59

## 2017-05-09 DIAGNOSIS — O3680X0 Pregnancy with inconclusive fetal viability, not applicable or unspecified: Secondary | ICD-10-CM

## 2017-05-09 LAB — URINE CULTURE

## 2017-05-09 LAB — GC/CHLAMYDIA PROBE AMP (~~LOC~~) NOT AT ARMC
CHLAMYDIA, DNA PROBE: NEGATIVE
NEISSERIA GONORRHEA: NEGATIVE

## 2017-05-10 ENCOUNTER — Encounter (HOSPITAL_COMMUNITY): Payer: Self-pay | Admitting: *Deleted

## 2017-05-10 ENCOUNTER — Inpatient Hospital Stay (HOSPITAL_COMMUNITY)
Admission: AD | Admit: 2017-05-10 | Discharge: 2017-05-10 | Disposition: A | Discharge status: Home / Self Care | Payer: 59 | Source: Ambulatory Visit | Attending: Obstetrics and Gynecology | Admitting: Obstetrics and Gynecology

## 2017-05-10 ENCOUNTER — Encounter: Payer: Self-pay | Admitting: Obstetrics & Gynecology

## 2017-05-10 ENCOUNTER — Ambulatory Visit (INDEPENDENT_AMBULATORY_CARE_PROVIDER_SITE_OTHER): Payer: 59

## 2017-05-10 ENCOUNTER — Other Ambulatory Visit: Payer: Self-pay | Admitting: Adult Health

## 2017-05-10 ENCOUNTER — Other Ambulatory Visit: Payer: 59

## 2017-05-10 ENCOUNTER — Other Ambulatory Visit: Payer: Self-pay | Admitting: Obstetrics & Gynecology

## 2017-05-10 ENCOUNTER — Ambulatory Visit (INDEPENDENT_AMBULATORY_CARE_PROVIDER_SITE_OTHER): Payer: 59 | Admitting: Obstetrics & Gynecology

## 2017-05-10 VITALS — BP 124/80 | HR 98 | Wt 202.0 lb

## 2017-05-10 DIAGNOSIS — O009 Unspecified ectopic pregnancy without intrauterine pregnancy: Secondary | ICD-10-CM | POA: Insufficient documentation

## 2017-05-10 DIAGNOSIS — Z818 Family history of other mental and behavioral disorders: Secondary | ICD-10-CM | POA: Insufficient documentation

## 2017-05-10 DIAGNOSIS — Z8249 Family history of ischemic heart disease and other diseases of the circulatory system: Secondary | ICD-10-CM | POA: Diagnosis not present

## 2017-05-10 DIAGNOSIS — O00109 Unspecified tubal pregnancy without intrauterine pregnancy: Secondary | ICD-10-CM

## 2017-05-10 DIAGNOSIS — Z6741 Type O blood, Rh negative: Secondary | ICD-10-CM | POA: Diagnosis not present

## 2017-05-10 DIAGNOSIS — Z3A01 Less than 8 weeks gestation of pregnancy: Secondary | ICD-10-CM

## 2017-05-10 DIAGNOSIS — O00102 Left tubal pregnancy without intrauterine pregnancy: Secondary | ICD-10-CM

## 2017-05-10 DIAGNOSIS — O3680X0 Pregnancy with inconclusive fetal viability, not applicable or unspecified: Secondary | ICD-10-CM

## 2017-05-10 DIAGNOSIS — N83201 Unspecified ovarian cyst, right side: Secondary | ICD-10-CM

## 2017-05-10 DIAGNOSIS — Z1389 Encounter for screening for other disorder: Secondary | ICD-10-CM

## 2017-05-10 DIAGNOSIS — O200 Threatened abortion: Secondary | ICD-10-CM

## 2017-05-10 LAB — CBC WITH DIFFERENTIAL/PLATELET
BASOS ABS: 0 10*3/uL (ref 0.0–0.1)
BASOS PCT: 0 %
EOS PCT: 0 %
Eosinophils Absolute: 0 10*3/uL (ref 0.0–0.7)
HCT: 36.1 % (ref 36.0–46.0)
Hemoglobin: 12.4 g/dL (ref 12.0–15.0)
LYMPHS PCT: 29 %
Lymphs Abs: 3.1 10*3/uL (ref 0.7–4.0)
MCH: 28.3 pg (ref 26.0–34.0)
MCHC: 34.3 g/dL (ref 30.0–36.0)
MCV: 82.4 fL (ref 78.0–100.0)
MONO ABS: 0.6 10*3/uL (ref 0.1–1.0)
Monocytes Relative: 6 %
Neutro Abs: 7 10*3/uL (ref 1.7–7.7)
Neutrophils Relative %: 65 %
PLATELETS: 270 10*3/uL (ref 150–400)
RBC: 4.38 MIL/uL (ref 3.87–5.11)
RDW: 13.1 % (ref 11.5–15.5)
WBC: 10.7 10*3/uL — ABNORMAL HIGH (ref 4.0–10.5)

## 2017-05-10 LAB — POCT URINALYSIS DIPSTICK
Glucose, UA: NEGATIVE
KETONES UA: NEGATIVE
Leukocytes, UA: NEGATIVE
Nitrite, UA: NEGATIVE
PROTEIN UA: NEGATIVE
RBC UA: NEGATIVE

## 2017-05-10 LAB — CREATININE, SERUM
Creatinine, Ser: 0.63 mg/dL (ref 0.44–1.00)
GFR calc Af Amer: >60 mL/min (ref >60)

## 2017-05-10 LAB — HCG, QUANTITATIVE, PREGNANCY: HCG, BETA CHAIN, QUANT, S: 228 m[IU]/mL — AB (ref <5)

## 2017-05-10 LAB — BUN: BUN: 12 mg/dL (ref 6–20)

## 2017-05-10 LAB — AST: AST: 18 U/L (ref 15–41)

## 2017-05-10 LAB — ABO/RH: ABO/RH(D): O NEG

## 2017-05-10 MED ORDER — RHO D IMMUNE GLOBULIN 1500 UNIT/2ML IJ SOSY
300.0000 ug | PREFILLED_SYRINGE | Freq: Once | INTRAMUSCULAR | Status: DC
  Filled 2017-05-10: qty 2

## 2017-05-10 MED ORDER — METHOTREXATE INJECTION FOR WOMEN'S HOSPITAL
50.0000 mg/m2 | Freq: Once | INTRAMUSCULAR | Status: AC
Start: 2017-05-10 — End: 2017-05-10
  Administered 2017-05-10: 100 mg via INTRAMUSCULAR
  Filled 2017-05-10: qty 2

## 2017-05-10 MED ORDER — RHO D IMMUNE GLOBULIN 1500 UNIT/2ML IJ SOSY: 300.0000 ug | PREFILLED_SYRINGE | Freq: Once | INTRAMUSCULAR | Status: DC

## 2017-05-10 MED ORDER — METHOTREXATE INJECTION FOR WOMEN'S HOSPITAL: 50.0000 mg/m2 | Freq: Once | INTRAMUSCULAR | Status: DC

## 2017-05-10 NOTE — Discharge Instructions (Signed)
Methotrexate Treatment for an Ectopic Pregnancy, Care After °Refer to this sheet in the next few weeks. These instructions provide you with information on caring for yourself after your procedure. Your health care provider may also give you more specific instructions. Your treatment has been planned according to current medical practices, but problems sometimes occur. Call your health care provider if you have any problems or questions after your procedure. °What can I expect after the procedure? °You may have some abdominal cramping, vaginal bleeding, and fatigue in the first few days after taking methotrexate. Some other possible side effects of methotrexate include: °· Nausea. °· Vomiting. °· Diarrhea. °· Mouth sores. °· Swelling or irritation of the lining of your lungs (pneumonitis). °· Liver damage. °· Hair loss. ° °Follow these instructions at home: °After you have received the methotrexate medicine, you need to be careful of your activities and watch your condition for several weeks. It may take 1 week before your hormone levels return to normal. °Activity °· Do not have sexual intercourse until your health care provider says it is safe to do so. °· You may resume your usual diet. °· Limit strenuous activity. °· Do not drink alcohol. °General instructions °· Do not take aspirin, ibuprofen, or naproxen (nonsteroidal anti-inflammatory drugs [NSAIDs]). °· Do not take folic acid, prenatal vitamins, or other vitamins that contain folic acid. °· Avoid traveling too far away from your health care provider. °· Keep all follow-up visits as told by your health care provider. This is important. °Contact a health care provider if: °· You cannot control your nausea and vomiting. °· You cannot control your diarrhea. °· You have sores in your mouth and want treatment. °· You need pain medicine for your abdominal pain. °· You have a rash. °· You are having a reaction to the medicine. °Get help right away if: °· You have  increasing abdominal or pelvic pain. °· You notice increased bleeding. °· You feel light-headed, or you faint. °· You have shortness of breath. °· Your heart rate increases. °· You have a cough. °· You have chills. °· You have a fever. °This information is not intended to replace advice given to you by your health care provider. Make sure you discuss any questions you have with your health care provider. °Document Released: 09/21/2011 Document Revised: 03/09/2016 Document Reviewed: 07/21/2013 °Elsevier Interactive Patient Education © 2017 Elsevier Inc. ° °

## 2017-05-10 NOTE — Progress Notes (Signed)
Follow up appointment for results  Chief Complaint  Patient presents with  . Follow-up    u/s, Hcg    Blood pressure 124/80, pulse 98, weight 202 lb (91.6 kg), last menstrual period 03/20/2017. HCGs remaining in the 200s, not rising, not viable by labs Progesterone 5.9 Study Result   DATING AND VIABILITY SONOGRAM   Megan Lowe is a 26 y.o. year old G2P0010 with LMP 03/20/2017  which would correlate to  11095w1d weeks gestation.  She has regular menstrual cycles.   She is here today for LLQ pain,no IUP seen on outside ultrasound.    GESTATION: No IUP visualized     FETAL ACTIVITY:          Heart rate      N/a    CERVIX: Appears closed   ADNEXA:  Hemorrhagic right ovarian cyst 3.1 x 3.7 x 3.3 cm w/arterial and venous flow,complex left adnexal mass adjacent to left ovary 3.3 x 1.3 x 2.1 cm   GESTATIONAL AGE AND  BIOMETRICS:  Gestational criteria: Estimated Date of Delivery: 12/26/17 by LMP now at 6995w1d  Previous Scans:1 outside ultrasound  GESTATIONAL SAC No IUP visualized,left adnexal mass   CROWN RUMP LENGTH                                                                           TECHNICIAN COMMENTS:  US TA/TV: homogeneous anteverted uterus,wnl,NO IUP visualized,EEC 5.5 mm, hemorrhagic right ovarian cyst 3.1 x 3.7 x 3.3 cm w/arterial and venous flow,complex left adnexal mass adjacent to left ovary 3.3 x 1.3 x 2.1 cm,complex cul de sac fluid,mult simple nabothian cysts,pt will have labs and see Megan.Ferguson today.        A copy of this report including all images has been saved and backed up to a second source for retrieval if needed. All measures and details of the anatomical scan, placentation, fluid volume and pelvic anatomy are contained in that report.  Megan Lowe 05/10/2017 8:39 AM   Clinical Impression and recommendations:  I have reviewed the sonogram results above, combined with  the patient's current clinical course, below are my impressions and any appropriate recommendations for management based on the sonographic findings.  Left Fallopian tube ectopic pregnancy. intact G2P0010 Estimated Date of Delivery: N/A  Normal general sonographic findings  Recommend:  appropriate for methotrexate therapy  Megan Lowe       MEDS ordered this encounter: No orders of the defined types were placed in this encounter.   Orders for this encounter: Orders Placed This Encounter  Procedures  . POCT urinalysis dipstick    Impression: Left tubal pregnancy without intrauterine pregnancy  Screening for genitourinary condition - Plan: POCT urinalysis dipstick   Plan: Arrangements made for the patient to go down to [facility] for IM methotrexate, which pt agrees with Will do follow up HCG her in the office  Follow Up: Return in about 6 days (around 05/16/2017) for 1015, Follow up, with Megan Lowe.       Face to face time:  15 minutes  Greater than 50% of the visit time was spent in counseling and coordination of care with the patient.  The summary and outline of the counseling and  care coordination is summarized in the note above.   All questions were answered.  Past Medical History:  Diagnosis Date  . Contraceptive management 07/28/2013  . Dyspareunia 11/04/2014  . Vaginal discharge 11/04/2014  . Yeast infection 11/04/2014    History reviewed. No pertinent surgical history.  OB History    Gravida Para Term Preterm AB Living   2 0 0 0 1 0   SAB TAB Ectopic Multiple Live Births   0 0 0 0        No Known Allergies  Social History   Social History  . Marital status: Married    Spouse name: N/A  . Number of children: N/A  . Years of education: N/A   Social History Main Topics  . Smoking status: Never Smoker  . Smokeless tobacco: Never Used  . Alcohol use No  . Drug use: No  . Sexual activity: Yes    Birth control/ protection: None    Other Topics Concern  . None   Social History Narrative  . None    Family History  Problem Relation Age of Onset  . Hypertension Mother   . Cancer Paternal Grandfather        skin   . Anxiety disorder Sister   . Depression Sister   . Pulmonary embolism Sister   . Other Paternal Grandmother        wears a heart monitor  . Hypertension Maternal Grandmother

## 2017-05-10 NOTE — Addendum Note (Signed)
Addended by: Moss McRESENZO, LATISHA M on: 05/10/2017 08:39 AM   Modules accepted: Orders

## 2017-05-10 NOTE — MAU Note (Signed)
Pt sent for MTX injection.

## 2017-05-10 NOTE — MAU Provider Note (Signed)
History     CSN: [id number]  Arrival date and time: 05/10/17 1209   First Provider Initiated Contact with Patient 05/10/17 1413      Chief Complaint  Patient presents with  . Ectopic Pregnancy   HPI [name] is a 26 y.o. G2P0010 at 24 [redacted]w[redacted]d who presents to MAU today from the office for MTX for ectopic pregnancy. The patient states lower abdominal pain since Tuesday. She denies vaginal bleeding or fever. She states that pain is moderate, but she has not taken the Norco prescribed.   OB History    Gravida Para Term Preterm AB Living   2 0 0 0 1 0   SAB TAB Ectopic Multiple Live Births   0 0 0 0        Past Medical History:  Diagnosis Date  . Contraceptive management 07/28/2013  . Dyspareunia 11/04/2014  . Vaginal discharge 11/04/2014  . Yeast infection 11/04/2014    No past surgical history on file.  Family History  Problem Relation Age of Onset  . Hypertension Mother   . Cancer Paternal Grandfather        skin   . Anxiety disorder Sister   . Depression Sister   . Pulmonary embolism Sister   . Other Paternal Grandmother        wears a heart monitor  . Hypertension Maternal Grandmother     Social History  Substance Use Topics  . Smoking status: Never Smoker  . Smokeless tobacco: Never Used  . Alcohol use No    Allergies: No Known Allergies  Facility-Administered Medications Prior to Admission  Medication Dose Route Frequency Provider Last Rate Last Dose  . methotrexate (50 mg/ml) chemo injection 100 mg  50 mg/m2 Intramuscular Once Lazaro ArmsEure, Luther H, MD      . rho (d) immune globulin (RHIG/RHOPHYLAC) injection 300 mcg  300 mcg Intramuscular Once Lazaro ArmsEure, Luther H, MD       Prescriptions Prior to Admission  Medication Sig Dispense Refill Last Dose  . HYDROcodone-acetaminophen (NORCO/VICODIN) 5-325 MG tablet Take 1 tablet by mouth every 6 (six) hours as needed. (Patient not taking: Reported on 05/10/2017) 20 tablet 0 Not Taking  . Prenatal Vit-Fe Fumarate-FA  (MULTIVITAMIN-PRENATAL) 27-0.8 MG TABS tablet Take 1 tablet by mouth daily at 12 noon.   Taking    Review of Systems  Constitutional: Negative for fever.  Gastrointestinal: Positive for abdominal pain.  Genitourinary: Negative for vaginal bleeding and vaginal discharge.   Physical Exam   Blood pressure 117/74, pulse 86, resp. rate 18, height 5\' 2"  (1.575 m), weight 200 lb 3.2 oz (90.8 kg), last menstrual period 03/20/2017.  Physical Exam  Nursing note and vitals reviewed. Constitutional: She is oriented to person, place, and time. She appears well-developed and well-nourished. No distress.  HENT:  Head: Normocephalic and atraumatic.  Cardiovascular: Normal rate.   Respiratory: Effort normal.  GI: Soft. She exhibits no distension and no mass. There is no tenderness. There is no rebound and no guarding.  Neurological: She is alert and oriented to person, place, and time.  Skin: Skin is warm and dry. No erythema.  Psychiatric: She has a normal mood and affect.    Results for orders placed or performed during the hospital encounter of 05/10/17 (from the past 24 hour(s))  hCG, quantitative, pregnancy     Status: Abnormal   Collection Time: 05/10/17  1:12 PM  Result Value Ref Range   hCG, Beta Chain, Quant, S 228 (H) <5 mIU/mL  CBC  WITH DIFFERENTIAL     Status: Abnormal   Collection Time: 05/10/17  1:12 PM  Result Value Ref Range   WBC 10.7 (H) 4.0 - 10.5 K/uL   RBC 4.38 3.87 - 5.11 MIL/uL   Hemoglobin 12.4 12.0 - 15.0 g/dL   HCT 16.136.1 09.636.0 - 04.546.0 %   MCV 82.4 78.0 - 100.0 fL   MCH 28.3 26.0 - 34.0 pg   MCHC 34.3 30.0 - 36.0 g/dL   RDW 40.913.1 81.111.5 - 91.415.5 %   Platelets 270 150 - 400 K/uL   Neutrophils Relative % 65 %   Neutro Abs 7.0 1.7 - 7.7 K/uL   Lymphocytes Relative 29 %   Lymphs Abs 3.1 0.7 - 4.0 K/uL   Monocytes Relative 6 %   Monocytes Absolute 0.6 0.1 - 1.0 K/uL   Eosinophils Relative 0 %   Eosinophils Absolute 0.0 0.0 - 0.7 K/uL   Basophils Relative 0 %   Basophils  Absolute 0.0 0.0 - 0.1 K/uL  AST     Status: None   Collection Time: 05/10/17  1:12 PM  Result Value Ref Range   AST 18 15 - 41 U/L  Creatinine, serum     Status: None   Collection Time: 05/10/17  1:12 PM  Result Value Ref Range   Creatinine, Ser 0.63 0.44 - 1.00 mg/dL   GFR calc non Af Amer >60 >60 mL/min   GFR calc Af Amer >60 >60 mL/min  BUN     Status: None   Collection Time: 05/10/17  1:12 PM  Result Value Ref Range   BUN 12 6 - 20 mg/dL  Rh IG workup (includes ABO/Rh)     Status: None (Preliminary result)   Collection Time: 05/10/17  1:12 PM  Result Value Ref Range   Gestational Age(Wks) 6    ABO/RH(D) O NEG    Antibody Screen PENDING     MAU Course  Procedures None  MDM Dr. Despina HiddenEure called ahead. He attempted to put in orders, but they were not visible to the laboratory staff. Orders placed upon patient arrival.  Lab results WNL. MTX ordered and given.  O negative blood type. Rhogam given last week. Will not repeat today.   Assessment and Plan  A: Ectopic pregnancy Rh negative  P:  Discharge home Continue Norco or Tylenol PRN for pain Ectopic precautions discussed Patient advised to follow-up in MAU on Sunday for Day #4 hCG lab [facility] Day #7 labs and follow-up with Dr. Despina HiddenEure as scheduled.  Patient may return to MAU as needed or if her condition were to change or worsen   Vonzella NippleJulie Wenzel, PA-C 05/10/2017, 2:31 PM

## 2017-05-10 NOTE — Progress Notes (Signed)
US TA/TV: homogeneous anteverted uterus,wnl,NO IUP visualized,EEC 5.5 mm, hemorrhagic right ovarian cyst 3.1 x 3.7 x 3.3 cm w/arterial and venous flow,complex left adnexal mass adjacent to left ovary 3.3 x 1.3 x 2.1 cm,complex cul de sac fluid,mult simple nabothian cysts

## 2017-05-11 ENCOUNTER — Ambulatory Visit: Payer: 59 | Admitting: Obstetrics & Gynecology

## 2017-05-11 LAB — RH IG WORKUP (INCLUDES ABO/RH)
ABO/RH(D): O NEG
ANTIBODY SCREEN: POSITIVE
DAT, IgG: NEGATIVE
GESTATIONAL AGE(WKS): 6

## 2017-05-13 ENCOUNTER — Inpatient Hospital Stay (HOSPITAL_COMMUNITY)
Admission: AD | Admit: 2017-05-13 | Discharge: 2017-05-13 | Disposition: A | Discharge status: Home / Self Care | Payer: 59 | Source: Ambulatory Visit | Attending: Obstetrics and Gynecology | Admitting: Obstetrics and Gynecology

## 2017-05-13 DIAGNOSIS — O00102 Left tubal pregnancy without intrauterine pregnancy: Secondary | ICD-10-CM | POA: Diagnosis not present

## 2017-05-13 LAB — HCG, QUANTITATIVE, PREGNANCY: HCG, BETA CHAIN, QUANT, S: 38 m[IU]/mL — AB (ref <5)

## 2017-05-13 NOTE — MAU Provider Note (Signed)
S: Megan Lowe is in today for f/u quant for ectopic pregnancy denies any complaints. Pt has appointment with Dr. Despina HiddenEure Wednesday for follow up and office visit. O: VSS  Quant today 38 down from 228 A: stable.  P: d/c home to f/u with Dr. Despina HiddenEure on Wednesday in office.

## 2017-05-13 NOTE — MAU Note (Signed)
States is doing ok.  Has had some pain. No sharp pains.  Denies bleeding.Day 4 post methotrexate.

## 2017-05-14 ENCOUNTER — Telehealth: Payer: Self-pay | Admitting: Obstetrics & Gynecology

## 2017-05-14 LAB — BETA HCG QUANT (REF LAB): hCG Quant: 260 m[IU]/mL

## 2017-05-14 LAB — PROGESTERONE: PROGESTERONE: 5.9 ng/mL

## 2017-05-14 NOTE — Telephone Encounter (Signed)
Patient states she cannot come at all this week for her follow-up due to her work schedule. Please advise as to what patient needs, labs, visit, etc.

## 2017-05-16 ENCOUNTER — Ambulatory Visit: Payer: 59 | Admitting: Obstetrics & Gynecology

## 2017-05-16 NOTE — Telephone Encounter (Signed)
perfect

## 2017-05-16 NOTE — Telephone Encounter (Signed)
Informed patient that she needs a repeat Hcg per Dr Despina HiddenEure sometime next week. Patient states she can come Monday around her lunch break.

## 2017-05-21 ENCOUNTER — Other Ambulatory Visit: Payer: 59

## 2017-05-21 DIAGNOSIS — O039 Complete or unspecified spontaneous abortion without complication: Secondary | ICD-10-CM

## 2017-05-22 ENCOUNTER — Encounter: Payer: Self-pay | Admitting: Women's Health

## 2017-05-22 LAB — BETA HCG QUANT (REF LAB): hCG Quant: 1 m[IU]/mL

## 2017-05-24 ENCOUNTER — Ambulatory Visit: Payer: 59 | Admitting: Women's Health

## 2017-08-01 ENCOUNTER — Telehealth: Payer: Self-pay | Admitting: *Deleted

## 2017-08-01 NOTE — Telephone Encounter (Signed)
Told pharmacy pt needs appt

## 2017-08-01 NOTE — Telephone Encounter (Signed)
Pharmacy called stating Loestrin Fe was $70 out of pocket. Would like to switch to another generic. Trisprintec covered if that is comparable. Please advise.

## 2017-08-06 ENCOUNTER — Encounter: Payer: Self-pay | Admitting: Women's Health

## 2017-08-07 ENCOUNTER — Ambulatory Visit: Payer: 59 | Admitting: Adult Health

## 2017-08-08 ENCOUNTER — Ambulatory Visit: Payer: Self-pay | Admitting: Adult Health

## 2017-08-09 ENCOUNTER — Other Ambulatory Visit: Payer: Self-pay | Admitting: Women's Health

## 2017-08-09 MED ORDER — NORGESTIM-ETH ESTRAD TRIPHASIC 0.18/0.215/0.25 MG-35 MCG PO TABS: 1.0000 | ORAL_TABLET | Freq: Every day | ORAL | 3 refills | Status: DC

## 2017-08-09 MED ORDER — NORETHIN-ETH ESTRAD-FE BIPHAS 1 MG-10 MCG / 10 MCG PO TABS: 1.0000 | ORAL_TABLET | Freq: Every day | ORAL | 3 refills | Status: DC

## 2017-08-13 ENCOUNTER — Ambulatory Visit: Payer: Self-pay | Admitting: Women's Health

## 2017-08-24 ENCOUNTER — Telehealth: Payer: Self-pay | Admitting: Women's Health

## 2017-08-24 NOTE — Telephone Encounter (Signed)
Pt called stating that since starting her new birth control pill she has been bleeding for 2 weeks. No c/o dizziness, lightheadedness. Average bleeding. She is not changing more than a pad an hour. Advised pt to give it 2 more weeks, that this can be common with a change in birth control. Advised to call back for an appt if she is still experiencing bleeding in 2 weeks or if bleeding increases or other s/s develop.

## 2017-08-30 ENCOUNTER — Encounter: Payer: Self-pay | Admitting: Women's Health

## 2017-10-16 NOTE — L&D Delivery Note (Signed)
Delivery Note At 4:47 PM a viable female was delivered via  (Presentation:ROA ;  ).  APGAR: , ; weight  . Per nursing noted  Placenta status: intact, spontaneous, .  Cord: 3VC  with the following complications: .none  Cord pH: n/a  Anesthesia:  epidural Episiotomy:  none Lacerations:  2nd Suture Repair: 2.0 3.0 vicryl rapide Est. Blood Loss (mL):  300  Mom to postpartum.  Baby to Couplet care / Skin to Skin.  Lendon Colonel 06/24/2018, 5:05 PM

## 2017-11-03 ENCOUNTER — Other Ambulatory Visit (HOSPITAL_COMMUNITY)
Admission: RE | Admit: 2017-11-03 | Discharge: 2017-11-03 | Disposition: A | Discharge status: Home / Self Care | Payer: 59 | Source: Ambulatory Visit | Attending: Obstetrics and Gynecology | Admitting: Obstetrics and Gynecology

## 2017-11-03 DIAGNOSIS — O00109 Unspecified tubal pregnancy without intrauterine pregnancy: Secondary | ICD-10-CM | POA: Insufficient documentation

## 2017-11-03 LAB — HCG, QUANTITATIVE, PREGNANCY: HCG, BETA CHAIN, QUANT, S: 2609 m[IU]/mL — AB (ref <5)

## 2017-11-05 ENCOUNTER — Other Ambulatory Visit: Payer: Self-pay | Admitting: Obstetrics and Gynecology

## 2017-12-11 LAB — OB RESULTS CONSOLE HIV ANTIBODY (ROUTINE TESTING): HIV: NONREACTIVE

## 2017-12-11 LAB — OB RESULTS CONSOLE HEPATITIS B SURFACE ANTIGEN: HEP B S AG: NEGATIVE

## 2017-12-11 LAB — OB RESULTS CONSOLE GC/CHLAMYDIA
Chlamydia: NEGATIVE
Gonorrhea: NEGATIVE

## 2017-12-11 LAB — OB RESULTS CONSOLE RPR: RPR: NONREACTIVE

## 2017-12-11 LAB — OB RESULTS CONSOLE RUBELLA ANTIBODY, IGM: RUBELLA: IMMUNE

## 2018-05-08 ENCOUNTER — Encounter (HOSPITAL_COMMUNITY): Payer: Self-pay | Admitting: *Deleted

## 2018-05-08 ENCOUNTER — Inpatient Hospital Stay (HOSPITAL_COMMUNITY)
Admission: AD | Admit: 2018-05-08 | Discharge: 2018-05-08 | Disposition: A | Discharge status: Home / Self Care | Payer: 59 | Source: Ambulatory Visit | Attending: Obstetrics and Gynecology | Admitting: Obstetrics and Gynecology

## 2018-05-08 DIAGNOSIS — O4703 False labor before 37 completed weeks of gestation, third trimester: Secondary | ICD-10-CM

## 2018-05-08 DIAGNOSIS — Z3A31 31 weeks gestation of pregnancy: Secondary | ICD-10-CM

## 2018-05-08 DIAGNOSIS — M549 Dorsalgia, unspecified: Secondary | ICD-10-CM | POA: Diagnosis present

## 2018-05-08 DIAGNOSIS — R109 Unspecified abdominal pain: Secondary | ICD-10-CM | POA: Diagnosis present

## 2018-05-08 DIAGNOSIS — O26893 Other specified pregnancy related conditions, third trimester: Secondary | ICD-10-CM | POA: Diagnosis present

## 2018-05-08 DIAGNOSIS — O479 False labor, unspecified: Secondary | ICD-10-CM

## 2018-05-08 LAB — URINALYSIS, ROUTINE W REFLEX MICROSCOPIC
BACTERIA UA: NONE SEEN
Bilirubin Urine: NEGATIVE
Glucose, UA: NEGATIVE mg/dL
Hgb urine dipstick: NEGATIVE
KETONES UR: NEGATIVE mg/dL
NITRITE: NEGATIVE
PROTEIN: NEGATIVE mg/dL
Specific Gravity, Urine: 1.01 (ref 1.005–1.030)
pH: 7 (ref 5.0–8.0)

## 2018-05-08 NOTE — MAU Provider Note (Signed)
History     CSN: [id number]  Arrival date and time: 05/08/18 [id number]   First Provider Initiated Contact with Patient 05/08/18 1913      Chief Complaint  Patient presents with  . Abdominal Pain  . Back Pain   HPI [name] is a 27 y.o. G3P0020 at 6321w4d who presents to MAU today with complaint of cramping and intermittent tightening since about noon today. She denies vaginal bleeding or LOF. She states that the tightening has been ~ q 15 minutes at most, but usually more sporadic. She reports normal fetal movement and denies complications with this pregnancy.   OB History    Gravida  3   Para  0   Term  0   Preterm  0   AB  2   Living  0     SAB  0   TAB  1   Ectopic  1   Multiple  0   Live Births              Past Medical History:  Diagnosis Date  . Contraceptive management 07/28/2013  . Dyspareunia 11/04/2014  . Vaginal discharge 11/04/2014  . Yeast infection 11/04/2014    Past Surgical History:  Procedure Laterality Date  . NO PAST SURGERIES      Family History  Problem Relation Age of Onset  . Hypertension Mother   . Cancer Paternal Grandfather        skin   . Anxiety disorder Sister   . Depression Sister   . Pulmonary embolism Sister   . Other Paternal Grandmother        wears a heart monitor  . Hypertension Maternal Grandmother     Social History   Tobacco Use  . Smoking status: Never Smoker  . Smokeless tobacco: Never Used  Substance Use Topics  . Alcohol use: No  . Drug use: No    Allergies: No Known Allergies  Medications Prior to Admission  Medication Sig Dispense Refill Last Dose  . HYDROcodone-acetaminophen (NORCO/VICODIN) 5-325 MG tablet Take 1 tablet by mouth every 6 (six) hours as needed. (Patient not taking: Reported on 05/10/2017) 20 tablet 0 Not Taking  . Norgestimate-Ethinyl Estradiol Triphasic (TRI-SPRINTEC) 0.18/0.215/0.25 MG-35 MCG tablet Take 1 tablet by mouth daily. 3 Package 3     Review of Systems   Constitutional: Negative for fever.  Gastrointestinal: Positive for abdominal pain and nausea. Negative for constipation, diarrhea and vomiting.  Genitourinary: Negative for dysuria, frequency, urgency, vaginal bleeding and vaginal discharge.   Physical Exam   Blood pressure 112/74, pulse (!) 102, temperature 98 F (36.7 C), temperature source Oral, resp. rate 16, height 5\' 3"  (1.6 m), weight 217 lb (98.4 kg), unknown if currently breastfeeding.  Physical Exam  Nursing note and vitals reviewed. Constitutional: She is oriented to person, place, and time. She appears well-developed and well-nourished. No distress.  HENT:  Head: Normocephalic and atraumatic.  Cardiovascular: Normal rate.  Respiratory: Effort normal.  GI: Soft. She exhibits no distension and no mass. There is no tenderness. There is no rebound and no guarding.  Neurological: She is alert and oriented to person, place, and time.  Skin: Skin is warm and dry. No erythema.  Psychiatric: She has a normal mood and affect.  Dilation: Closed Effacement (%): Thick Cervical Position: Posterior Exam by:: Harlon FlorJ Wenzel PA    Results for orders placed or performed during the hospital encounter of 05/08/18 (from the past 24 hour(s))  Urinalysis, Routine w reflex  microscopic     Status: Abnormal   Collection Time: 05/08/18  6:50 PM  Result Value Ref Range   Color, Urine YELLOW YELLOW   APPearance CLEAR CLEAR   Specific Gravity, Urine 1.010 1.005 - 1.030   pH 7.0 5.0 - 8.0   Glucose, UA NEGATIVE NEGATIVE mg/dL   Hgb urine dipstick NEGATIVE NEGATIVE   Bilirubin Urine NEGATIVE NEGATIVE   Ketones, ur NEGATIVE NEGATIVE mg/dL   Protein, ur NEGATIVE NEGATIVE mg/dL   Nitrite NEGATIVE NEGATIVE   Leukocytes, UA TRACE (A) NEGATIVE   RBC / HPF 0-5 0 - 5 RBC/hpf   WBC, UA 0-5 0 - 5 WBC/hpf   Bacteria, UA NONE SEEN NONE SEEN   Squamous Epithelial / LPF 0-5 0 - 5   Fetal Monitoring: Baseline: 120 bpm Variability: moderate Accelerations:  15 x 15 Decelerations: none Contractions: few irregular with mild UI  MAU Course  Procedures None  MDM Declined anti-emetics in MAU today PO hydration while in MAU  Discussed patient with Dr. Billy Coast. Ok for discharge at this time with labor precautions. Follow-up as scheduled.   Assessment and Plan  A: SIUP at [redacted]w[redacted]d Braxton hicks contractions  Uterine irritability   P: Discharge home Preterm labor precautions discussed Patient advised to follow-up with [facility] as scheduled for routine prenatal care Patient may return to MAU as needed or if her condition were to change or worsen  Vonzella Nipple, PA-C 05/08/2018, 8:14 PM

## 2018-05-08 NOTE — MAU Note (Signed)
Pt C/O abdominal tightening, began having cramping & lower back pain, called MD office, was advised to come to MAU.  Denies bleeding or LOF.  Reports good fetal movement.

## 2018-05-08 NOTE — Discharge Instructions (Signed)
Braxton Hicks Contractions °Contractions of the uterus can occur throughout pregnancy, but they are not always a sign that you are in labor. You may have practice contractions called Braxton Hicks contractions. These false labor contractions are sometimes confused with true labor. °What are Braxton Hicks contractions? °Braxton Hicks contractions are tightening movements that occur in the muscles of the uterus before labor. Unlike true labor contractions, these contractions do not result in opening (dilation) and thinning of the cervix. Toward the end of pregnancy (32-34 weeks), Braxton Hicks contractions can happen more often and may become stronger. These contractions are sometimes difficult to tell apart from true labor because they can be very uncomfortable. You should not feel embarrassed if you go to the hospital with false labor. °Sometimes, the only way to tell if you are in true labor is for your health care provider to look for changes in the cervix. The health care provider will do a physical exam and may monitor your contractions. If you are not in true labor, the exam should show that your cervix is not dilating and your water has not broken. °If there are other health problems associated with your pregnancy, it is completely safe for you to be sent home with false labor. You may continue to have Braxton Hicks contractions until you go into true labor. °How to tell the difference between true labor and false labor °True labor °· Contractions last 30-70 seconds. °· Contractions become very regular. °· Discomfort is usually felt in the top of the uterus, and it spreads to the lower abdomen and low back. °· Contractions do not go away with walking. °· Contractions usually become more intense and increase in frequency. °· The cervix dilates and gets thinner. °False labor °· Contractions are usually shorter and not as strong as true labor contractions. °· Contractions are usually irregular. °· Contractions  are often felt in the front of the lower abdomen and in the groin. °· Contractions may go away when you walk around or change positions while lying down. °· Contractions get weaker and are shorter-lasting as time goes on. °· The cervix usually does not dilate or become thin. °Follow these instructions at home: °· Take over-the-counter and prescription medicines only as told by your health care provider. °· Keep up with your usual exercises and follow other instructions from your health care provider. °· Eat and drink lightly if you think you are going into labor. °· If Braxton Hicks contractions are making you uncomfortable: °? Change your position from lying down or resting to walking, or change from walking to resting. °? Sit and rest in a tub of warm water. °? Drink enough fluid to keep your urine pale yellow. Dehydration may cause these contractions. °? Do slow and deep breathing several times an hour. °· Keep all follow-up prenatal visits as told by your health care provider. This is important. °Contact a health care provider if: °· You have a fever. °· You have continuous pain in your abdomen. °Get help right away if: °· Your contractions become stronger, more regular, and closer together. °· You have fluid leaking or gushing from your vagina. °· You pass blood-tinged mucus (bloody show). °· You have bleeding from your vagina. °· You have low back pain that you never had before. °· You feel your baby’s head pushing down and causing pelvic pressure. °· Your baby is not moving inside you as much as it used to. °Summary °· Contractions that occur before labor are called Braxton   Hicks contractions, false labor, or practice contractions. °· Braxton Hicks contractions are usually shorter, weaker, farther apart, and less regular than true labor contractions. True labor contractions usually become progressively stronger and regular and they become more frequent. °· Manage discomfort from Braxton Hicks contractions by  changing position, resting in a warm bath, drinking plenty of water, or practicing deep breathing. °This information is not intended to replace advice given to you by your health care provider. Make sure you discuss any questions you have with your health care provider. °Document Released: 02/15/2017 Document Revised: 02/15/2017 Document Reviewed: 02/15/2017 °Elsevier Interactive Patient Education © 2018 [employer] Inc. ° ° °Pelvic Rest °Pelvic rest may be recommended if: °· Your placenta is partially or completely covering the opening of your cervix (placenta previa). °· There is bleeding between the wall of the uterus and the amniotic sac in the first trimester of pregnancy (subchorionic hemorrhage). °· You went into labor too early (preterm labor). ° °Based on your overall health and the health of your baby, your health care provider will decide if pelvic rest is right for you. °How do I rest my pelvis? °For as long as told by your health care provider: °· Do not have sex, sexual stimulation, or an orgasm. °· Do not use tampons. Do not douche. Do not put anything in your vagina. °· Do not lift anything that is heavier than 10 lb (4.5 kg). °· Avoid activities that take a lot of effort (are strenuous). °· Avoid any activity in which your pelvic muscles could become strained. ° °When should I seek medical care? °Seek medical care if you have: °· Cramping pain in your lower abdomen. °· Vaginal discharge. °· A low, dull backache. °· Regular contractions. °· Uterine tightening. ° °When should I seek immediate medical care? °Seek immediate medical care if: °· You have vaginal bleeding and you are pregnant. ° °This information is not intended to replace advice given to you by your health care provider. Make sure you discuss any questions you have with your health care provider. °Document Released: 01/27/2011 Document Revised: 03/09/2016 Document Reviewed: 04/05/2015 °Elsevier Interactive Patient Education © 2018 [employer]  Inc[employer] ° ° °Fetal Movement Counts °Patient Name: ________________________________________________ Patient Due Date: ____________________ °What is a fetal movement count? °A fetal movement count is the number of times that you feel your baby move during a certain amount of time. This may also be called a fetal kick count. A fetal movement count is recommended for every pregnant woman. You may be asked to start counting fetal movements as early as week 28 of your pregnancy. °Pay attention to when your baby is most active. You may notice your baby's sleep and wake cycles. You may also notice things that make your baby move more. You should do a fetal movement count: °· When your baby is normally most active. °· At the same time each day. ° °A good time to count movements is while you are resting, after having something to eat and drink. °How do I count fetal movements? °1. Find a quiet, comfortable area. Sit, or lie down on your side. °2. Write down the date, the start time and stop time, and the number of movements that you felt between those two times. Take this information with you to your health care visits. °3. For 2 hours, count kicks, flutters, swishes, rolls, and jabs. You should feel at least 10 movements during 2 hours. °4. You may stop counting after you have felt 10 movements. °5.   If you do not feel 10 movements in 2 hours, have something to eat and drink. Then, keep resting and counting for 1 hour. If you feel at least 4 movements during that hour, you may stop counting. °Contact a health care provider if: °· You feel fewer than 4 movements in 2 hours. °· Your baby is not moving like he or she usually does. °Date: ____________ Start time: ____________ Stop time: ____________ Movements: ____________ °Date: ____________ Start time: ____________ Stop time: ____________ Movements: ____________ °Date: ____________ Start time: ____________ Stop time: ____________ Movements: ____________ °Date: ____________ Start  time: ____________ Stop time: ____________ Movements: ____________ °Date: ____________ Start time: ____________ Stop time: ____________ Movements: ____________ °Date: ____________ Start time: ____________ Stop time: ____________ Movements: ____________ °Date: ____________ Start time: ____________ Stop time: ____________ Movements: ____________ °Date: ____________ Start time: ____________ Stop time: ____________ Movements: ____________ °Date: ____________ Start time: ____________ Stop time: ____________ Movements: ____________ °This information is not intended to replace advice given to you by your health care provider. Make sure you discuss any questions you have with your health care provider. °Document Released: 11/01/2006 Document Revised: 05/31/2016 Document Reviewed: 11/11/2015 °Elsevier Interactive Patient Education © 2018 Elsevier Inc. ° °

## 2018-06-03 ENCOUNTER — Encounter (HOSPITAL_COMMUNITY): Payer: Self-pay | Admitting: *Deleted

## 2018-06-03 ENCOUNTER — Inpatient Hospital Stay (HOSPITAL_COMMUNITY)
Admission: AD | Admit: 2018-06-03 | Discharge: 2018-06-03 | Disposition: A | Discharge status: Home / Self Care | Payer: Managed Care, Other (non HMO) | Source: Ambulatory Visit | Attending: Obstetrics and Gynecology | Admitting: Obstetrics and Gynecology

## 2018-06-03 ENCOUNTER — Other Ambulatory Visit: Payer: Self-pay

## 2018-06-03 DIAGNOSIS — O26893 Other specified pregnancy related conditions, third trimester: Secondary | ICD-10-CM | POA: Insufficient documentation

## 2018-06-03 DIAGNOSIS — Z3A35 35 weeks gestation of pregnancy: Secondary | ICD-10-CM | POA: Insufficient documentation

## 2018-06-03 DIAGNOSIS — N898 Other specified noninflammatory disorders of vagina: Secondary | ICD-10-CM | POA: Diagnosis not present

## 2018-06-03 LAB — URINALYSIS, ROUTINE W REFLEX MICROSCOPIC
Bilirubin Urine: NEGATIVE
Glucose, UA: NEGATIVE mg/dL
Hgb urine dipstick: NEGATIVE
Ketones, ur: NEGATIVE mg/dL
NITRITE: NEGATIVE
PROTEIN: 30 mg/dL — AB
Specific Gravity, Urine: 1.03 (ref 1.005–1.030)
pH: 5 (ref 5.0–8.0)

## 2018-06-03 LAB — POCT FERN TEST: POCT FERN TEST: NEGATIVE

## 2018-06-03 NOTE — Discharge Instructions (Signed)
Labor precautions

## 2018-06-03 NOTE — MAU Note (Signed)
Been like leaking today, watery and clear. No bleeding. Some Braxton Hicks, no change (3-4/hr).  No pad, pants aren't wet

## 2018-06-03 NOTE — MAU Provider Note (Signed)
History     Chief Complaint  Patient presents with  . Abdominal Pain  . Rupture of Membranes  26 y0 G2P0010 WF @ 35 2/7 weeks presents with c/o leaking clear fluid all day. (+) FM. Uncomplicated preg per pt.  Pt was not wearing underwear   OB History    Gravida  3   Para  0   Term  0   Preterm  0   AB  2   Living  0     SAB  0   TAB  1   Ectopic  1   Multiple  0   Live Births              Past Medical History:  Diagnosis Date  . Contraceptive management 07/28/2013  . Dyspareunia 11/04/2014  . Vaginal discharge 11/04/2014  . Yeast infection 11/04/2014    Past Surgical History:  Procedure Laterality Date  . NO PAST SURGERIES      Family History  Problem Relation Age of Onset  . Hypertension Mother   . Cancer Paternal Grandfather        skin   . Anxiety disorder Sister   . Depression Sister   . Pulmonary embolism Sister   . Other Paternal Grandmother        wears a heart monitor  . Hypertension Maternal Grandmother     Social History   Tobacco Use  . Smoking status: Never Smoker  . Smokeless tobacco: Never Used  Substance Use Topics  . Alcohol use: No  . Drug use: No    Allergies: No Known Allergies  No medications prior to admission.     Physical Exam   Blood pressure 114/74, pulse 91, temperature 98.2 F (36.8 C), temperature source Oral, resp. rate 18, SpO2 100 %, unknown if currently breastfeeding.  General appearance: alert, cooperative and no distress Lungs: clear to auscultation bilaterally Heart: regular rate and rhythm, S1, S2 normal, no murmur, click, rub or gallop Abdomen: gravid soft non tender FH 37 cm Pelvic: external genitalia normal and vulva  nl small varicosities. Vagina: mucoid yellow discharge around clitoris adn opening, sE. mucopurulent yellow d/c noted  No fluid leak with cough Cervix(+) wide ectropion. (+) mucus Closed/long/-4  Tracing; baseline 145(+) accels 160 Ctx no ctx  Fern neg ED Course  IMP:  vaginal discharge in pregnancy IUP@ 35 2/7 weeks P) d/c home. Labor prec. Keep sched OB appt next week MDM   Serita KyleSheronette A Vikkie Goeden, MD 8:30 PM 06/03/2018

## 2018-06-11 LAB — OB RESULTS CONSOLE GBS: GBS: NEGATIVE

## 2018-06-24 ENCOUNTER — Encounter (HOSPITAL_COMMUNITY): Payer: Self-pay | Admitting: *Deleted

## 2018-06-24 ENCOUNTER — Inpatient Hospital Stay (HOSPITAL_COMMUNITY): Payer: Managed Care, Other (non HMO) | Admitting: Anesthesiology

## 2018-06-24 ENCOUNTER — Inpatient Hospital Stay (HOSPITAL_COMMUNITY)
Admission: AD | Admit: 2018-06-24 | Discharge: 2018-06-26 | DRG: 807 | Disposition: A | Discharge status: Home / Self Care | Payer: Managed Care, Other (non HMO) | Attending: Obstetrics | Admitting: Obstetrics

## 2018-06-24 ENCOUNTER — Other Ambulatory Visit: Payer: Self-pay

## 2018-06-24 DIAGNOSIS — O26893 Other specified pregnancy related conditions, third trimester: Secondary | ICD-10-CM | POA: Diagnosis present

## 2018-06-24 DIAGNOSIS — Z6791 Unspecified blood type, Rh negative: Secondary | ICD-10-CM | POA: Diagnosis not present

## 2018-06-24 DIAGNOSIS — O4292 Full-term premature rupture of membranes, unspecified as to length of time between rupture and onset of labor: Secondary | ICD-10-CM | POA: Diagnosis present

## 2018-06-24 DIAGNOSIS — Z3A38 38 weeks gestation of pregnancy: Secondary | ICD-10-CM

## 2018-06-24 DIAGNOSIS — O26899 Other specified pregnancy related conditions, unspecified trimester: Secondary | ICD-10-CM

## 2018-06-24 LAB — CBC
HCT: 30.8 % — ABNORMAL LOW (ref 36.0–46.0)
HEMOGLOBIN: 10.6 g/dL — AB (ref 12.0–15.0)
MCH: 28 pg (ref 26.0–34.0)
MCHC: 34.4 g/dL (ref 30.0–36.0)
MCV: 81.5 fL (ref 78.0–100.0)
Platelets: 190 10*3/uL (ref 150–400)
RBC: 3.78 MIL/uL — AB (ref 3.87–5.11)
RDW: 14.8 % (ref 11.5–15.5)
WBC: 12.2 10*3/uL — ABNORMAL HIGH (ref 4.0–10.5)

## 2018-06-24 LAB — RPR: RPR: NONREACTIVE

## 2018-06-24 LAB — POCT FERN TEST: POCT FERN TEST: POSITIVE

## 2018-06-24 MED ORDER — OXYTOCIN 40 UNITS IN LACTATED RINGERS INFUSION - SIMPLE MED: 2.5000 [IU]/h | INTRAVENOUS | Status: DC

## 2018-06-24 MED ORDER — SODIUM BICARBONATE 8.4 % IV SOLN
INTRAVENOUS | Status: DC | PRN
  Administered 2018-06-24: 5 mL via EPIDURAL

## 2018-06-24 MED ORDER — LACTATED RINGERS IV SOLN
INTRAVENOUS | Status: DC
  Administered 2018-06-24 (×3): via INTRAVENOUS

## 2018-06-24 MED ORDER — OXYCODONE-ACETAMINOPHEN 5-325 MG PO TABS: 1.0000 | ORAL_TABLET | ORAL | Status: DC | PRN

## 2018-06-24 MED ORDER — PHENYLEPHRINE 40 MCG/ML (10ML) SYRINGE FOR IV PUSH (FOR BLOOD PRESSURE SUPPORT)
80.0000 ug | PREFILLED_SYRINGE | INTRAVENOUS | Status: DC | PRN
  Filled 2018-06-24: qty 5

## 2018-06-24 MED ORDER — ONDANSETRON HCL 4 MG PO TABS: 4.0000 mg | ORAL_TABLET | ORAL | Status: DC | PRN

## 2018-06-24 MED ORDER — IBUPROFEN 600 MG PO TABS
600.0000 mg | ORAL_TABLET | Freq: Four times a day (QID) | ORAL | Status: DC
  Administered 2018-06-24 – 2018-06-26 (×7): 600 mg via ORAL
  Filled 2018-06-24 (×7): qty 1

## 2018-06-24 MED ORDER — LACTATED RINGERS IV SOLN: 500.0000 mL | INTRAVENOUS | Status: DC | PRN

## 2018-06-24 MED ORDER — OXYTOCIN 40 UNITS IN LACTATED RINGERS INFUSION - SIMPLE MED
1.0000 m[IU]/min | INTRAVENOUS | Status: DC
  Administered 2018-06-24: 6 m[IU]/min via INTRAVENOUS
  Administered 2018-06-24: 8 m[IU]/min via INTRAVENOUS

## 2018-06-24 MED ORDER — ACETAMINOPHEN 325 MG PO TABS: 650.0000 mg | ORAL_TABLET | ORAL | Status: DC | PRN

## 2018-06-24 MED ORDER — LIDOCAINE HCL (PF) 1 % IJ SOLN
30.0000 mL | INTRAMUSCULAR | Status: DC | PRN
  Filled 2018-06-24: qty 30

## 2018-06-24 MED ORDER — FLEET ENEMA 7-19 GM/118ML RE ENEM: 1.0000 | ENEMA | RECTAL | Status: DC | PRN

## 2018-06-24 MED ORDER — DIPHENHYDRAMINE HCL 25 MG PO CAPS: 25.0000 mg | ORAL_CAPSULE | Freq: Four times a day (QID) | ORAL | Status: DC | PRN

## 2018-06-24 MED ORDER — DIBUCAINE 1 % RE OINT: 1.0000 "application " | TOPICAL_OINTMENT | RECTAL | Status: DC | PRN

## 2018-06-24 MED ORDER — DIPHENHYDRAMINE HCL 50 MG/ML IJ SOLN: 12.5000 mg | INTRAMUSCULAR | Status: DC | PRN

## 2018-06-24 MED ORDER — ONDANSETRON HCL 4 MG/2ML IJ SOLN
4.0000 mg | Freq: Four times a day (QID) | INTRAMUSCULAR | Status: DC | PRN
  Filled 2018-06-24: qty 2

## 2018-06-24 MED ORDER — EPHEDRINE 5 MG/ML INJ
10.0000 mg | INTRAVENOUS | Status: DC | PRN
  Filled 2018-06-24: qty 2

## 2018-06-24 MED ORDER — PHENYLEPHRINE 40 MCG/ML (10ML) SYRINGE FOR IV PUSH (FOR BLOOD PRESSURE SUPPORT)
80.0000 ug | PREFILLED_SYRINGE | INTRAVENOUS | Status: DC | PRN
  Filled 2018-06-24: qty 10
  Filled 2018-06-24: qty 5
  Filled 2018-06-24: qty 10

## 2018-06-24 MED ORDER — WITCH HAZEL-GLYCERIN EX PADS: 1.0000 "application " | MEDICATED_PAD | CUTANEOUS | Status: DC | PRN

## 2018-06-24 MED ORDER — PRENATAL MULTIVITAMIN CH
1.0000 | ORAL_TABLET | Freq: Every day | ORAL | Status: DC
  Administered 2018-06-25: 1 via ORAL
  Filled 2018-06-24: qty 1

## 2018-06-24 MED ORDER — SOD CITRATE-CITRIC ACID 500-334 MG/5ML PO SOLN: 30.0000 mL | ORAL | Status: DC | PRN

## 2018-06-24 MED ORDER — LACTATED RINGERS IV SOLN
500.0000 mL | Freq: Once | INTRAVENOUS | Status: AC
  Administered 2018-06-24: 500 mL via INTRAVENOUS

## 2018-06-24 MED ORDER — OXYCODONE-ACETAMINOPHEN 5-325 MG PO TABS: 2.0000 | ORAL_TABLET | ORAL | Status: DC | PRN

## 2018-06-24 MED ORDER — SENNOSIDES-DOCUSATE SODIUM 8.6-50 MG PO TABS
2.0000 | ORAL_TABLET | ORAL | Status: DC
  Administered 2018-06-24 – 2018-06-25 (×2): 2 via ORAL
  Filled 2018-06-24 (×2): qty 2

## 2018-06-24 MED ORDER — OXYCODONE HCL 5 MG PO TABS: 10.0000 mg | ORAL_TABLET | ORAL | Status: DC | PRN

## 2018-06-24 MED ORDER — TERBUTALINE SULFATE 1 MG/ML IJ SOLN
0.2500 mg | Freq: Once | INTRAMUSCULAR | Status: DC | PRN
  Filled 2018-06-24: qty 1

## 2018-06-24 MED ORDER — OXYCODONE HCL 5 MG PO TABS: 5.0000 mg | ORAL_TABLET | ORAL | Status: DC | PRN

## 2018-06-24 MED ORDER — SIMETHICONE 80 MG PO CHEW: 80.0000 mg | CHEWABLE_TABLET | ORAL | Status: DC | PRN

## 2018-06-24 MED ORDER — BENZOCAINE-MENTHOL 20-0.5 % EX AERO
1.0000 "application " | INHALATION_SPRAY | CUTANEOUS | Status: DC | PRN
  Filled 2018-06-24: qty 56

## 2018-06-24 MED ORDER — ZOLPIDEM TARTRATE 5 MG PO TABS: 5.0000 mg | ORAL_TABLET | Freq: Every evening | ORAL | Status: DC | PRN

## 2018-06-24 MED ORDER — ONDANSETRON HCL 4 MG/2ML IJ SOLN: 4.0000 mg | INTRAMUSCULAR | Status: DC | PRN

## 2018-06-24 MED ORDER — COCONUT OIL OIL: 1.0000 "application " | TOPICAL_OIL | Status: DC | PRN

## 2018-06-24 MED ORDER — OXYTOCIN 40 UNITS IN LACTATED RINGERS INFUSION - SIMPLE MED
1.0000 m[IU]/min | INTRAVENOUS | Status: DC
  Administered 2018-06-24: 1 m[IU]/min via INTRAVENOUS
  Administered 2018-06-24: 4 m[IU]/min via INTRAVENOUS
  Administered 2018-06-24: 3 m[IU]/min via INTRAVENOUS
  Filled 2018-06-24: qty 1000

## 2018-06-24 MED ORDER — LIDOCAINE HCL (PF) 1 % IJ SOLN
INTRAMUSCULAR | Status: DC | PRN
  Administered 2018-06-24 (×2): 5 mL via EPIDURAL

## 2018-06-24 MED ORDER — TETANUS-DIPHTH-ACELL PERTUSSIS 5-2.5-18.5 LF-MCG/0.5 IM SUSP: 0.5000 mL | Freq: Once | INTRAMUSCULAR | Status: DC

## 2018-06-24 MED ORDER — FENTANYL 2.5 MCG/ML BUPIVACAINE 1/10 % EPIDURAL INFUSION (WH - ANES)
14.0000 mL/h | INTRAMUSCULAR | Status: DC | PRN
  Administered 2018-06-24 (×2): 14 mL/h via EPIDURAL
  Filled 2018-06-24 (×2): qty 100

## 2018-06-24 MED ORDER — OXYTOCIN BOLUS FROM INFUSION
500.0000 mL | Freq: Once | INTRAVENOUS | Status: AC
  Administered 2018-06-24: 500 mL via INTRAVENOUS

## 2018-06-24 NOTE — Progress Notes (Signed)
Megan Lowe is a 27 y.o. G3P0020 at [redacted]w[redacted]d by LMP admitted for active labor  Subjective: comfortable  Objective: BP 138/84   Pulse 100   Temp 97.8 F (36.6 C)   Resp 18   Ht 5\' 3"  (1.6 m)   Wt 99.8 kg   SpO2 99%   BMI 38.97 kg/m  No intake/output data recorded. No intake/output data recorded.  FHT:  FHR: 125 bpm, variability: moderate,  accelerations:  Present,  decelerations:  Absent UC:   regular, every 3 minutes SVE:   Dilation: 5.5 Effacement (%): 70 Station: 0 Exam by:: Gifford Shave, RN  Labs: Lab Results  Component Value Date   WBC 12.2 (H) 06/24/2018   HGB 10.6 (L) 06/24/2018   HCT 30.8 (L) 06/24/2018   MCV 81.5 06/24/2018   PLT 190 06/24/2018    Assessment / Plan: Augmentation of labor, progressing well  Labor: Progressing normally Preeclampsia:  no signs or symptoms of toxicity Fetal Wellbeing:  Category I Pain Control:  Epidural I/D:  n/a Anticipated MOD:  NSVD  Megan Lowe J 06/24/2018, 10:41 AM

## 2018-06-24 NOTE — Anesthesia Procedure Notes (Signed)
Epidural Patient location during procedure: OB  Staffing Anesthesiologist: Jalaine Riggenbach, MD Performed: anesthesiologist   Preanesthetic Checklist Completed: patient identified, site marked, surgical consent, pre-op evaluation, timeout performed, IV checked, risks and benefits discussed and monitors and equipment checked  Epidural Patient position: sitting Prep: DuraPrep Patient monitoring: heart rate, continuous pulse ox and blood pressure Approach: midline Location: L3-L4 Injection technique: LOR saline  Needle:  Needle type: Tuohy  Needle gauge: 17 G Needle length: 9 cm and 9 Needle insertion depth: 6 cm Catheter type: closed end flexible Catheter size: 20 Guage Catheter at skin depth: 10 cm Test dose: negative  Assessment Events: blood not aspirated, injection not painful, no injection resistance, negative IV test and no paresthesia  Additional Notes Patient identified. Risks/Benefits/Options discussed with patient including but not limited to bleeding, infection, nerve damage, paralysis, failed block, incomplete pain control, headache, blood pressure changes, nausea, vomiting, reactions to medication both or allergic, itching and postpartum back pain. Confirmed with bedside nurse the patient's most recent platelet count. Confirmed with patient that they are not currently taking any anticoagulation, have any bleeding history or any family history of bleeding disorders. Patient expressed understanding and wished to proceed. All questions were answered. Sterile technique was used throughout the entire procedure. Please see nursing notes for vital signs. Test dose was given through epidural needle and negative prior to continuing to dose epidural or start infusion. Warning signs of high block given to the patient including shortness of breath, tingling/numbness in hands, complete motor block, or any concerning symptoms with instructions to call for help. Patient was given  instructions on fall risk and not to get out of bed. All questions and concerns addressed with instructions to call with any issues.     

## 2018-06-24 NOTE — H&P (Signed)
Megan Lowe is a 27 y.o. female presenting for SROM at term. OB History    Gravida  3   Para  0   Term  0   Preterm  0   AB  2   Living  0     SAB  0   TAB  1   Ectopic  1   Multiple  0   Live Births             Past Medical History:  Diagnosis Date  . Contraceptive management 07/28/2013  . Dyspareunia 11/04/2014  . Vaginal discharge 11/04/2014  . Yeast infection 11/04/2014   Past Surgical History:  Procedure Laterality Date  . NO PAST SURGERIES     Family History: family history includes Anxiety disorder in her sister; Cancer in her paternal grandfather; Depression in her sister; Hypertension in her maternal grandmother and mother; Other in her paternal grandmother; Pulmonary embolism in her sister. Social History:  reports that she has never smoked. She has never used smokeless tobacco. She reports that she does not drink alcohol or use drugs.     Maternal Diabetes: No Genetic Screening: Normal Maternal Ultrasounds/Referrals: Normal Fetal Ultrasounds or other Referrals:  None Maternal Substance Abuse:  No Significant Maternal Medications:  None Significant Maternal Lab Results:  None Other Comments:  None  Review of Systems  Constitutional: Negative.   All other systems reviewed and are negative.  Maternal Medical History:  Reason for admission: Rupture of membranes.   Contractions: Onset was less than 1 hour ago.   Frequency: irregular.   Perceived severity is mild.    Fetal activity: Perceived fetal activity is normal.   Last perceived fetal movement was within the past hour.    Prenatal complications: no prenatal complications Prenatal Complications - Diabetes: none.    Dilation: 3.5 Effacement (%): 70 Station: -2 Exam by:: Paulette Fox RN Blood pressure 121/81, pulse 95, temperature 98.2 F (36.8 C), temperature source Oral, resp. rate 17, height 5\' 3"  (1.6 m), weight 99.8 kg, SpO2 100 %, unknown if currently  breastfeeding. Maternal Exam:  Uterine Assessment: Contraction strength is moderate.  Contraction frequency is regular.   Abdomen: Patient reports no abdominal tenderness. Fetal presentation: vertex  Introitus: Normal vulva. Normal vagina.  Ferning test: positive.  Nitrazine test: positive. Amniotic fluid character: clear.  Pelvis: adequate for delivery.   Cervix: Cervix evaluated by digital exam.     Physical Exam  Nursing note and vitals reviewed. Constitutional: She is oriented to person, place, and time. She appears well-developed and well-nourished.  HENT:  Head: Normocephalic and atraumatic.  Neck: Normal range of motion. Neck supple.  Cardiovascular: Normal rate and regular rhythm.  Respiratory: Effort normal and breath sounds normal.  GI: Soft. Bowel sounds are normal.  Genitourinary: Vagina normal and uterus normal.  Musculoskeletal: Normal range of motion.  Neurological: She is alert and oriented to person, place, and time. She has normal reflexes.  Skin: Skin is warm and dry.  Psychiatric: She has a normal mood and affect.    Prenatal labs: ABO, Rh: --/--/O NEG (09/09 0142) Antibody: POS (09/09 0142) Rubella:   RPR:    HBsAg:    HIV:    GBS:     Assessment/Plan: SROM at term Admit Augment labor   Danarius Mcconathy J 06/24/2018, 6:41 AM

## 2018-06-24 NOTE — MAU Note (Signed)
Pt reports SROM @ 2315 along w/ some blood when wiping. Pt having some contractions.

## 2018-06-24 NOTE — Progress Notes (Signed)
S: Doing well, no complaints, pain  controlled with epidural though needing top off now.   O: BP 140/88   Pulse 85   Temp 98 F (36.7 C)   Resp 18   Ht 5\' 3"  (1.6 m)   Wt 99.8 kg   SpO2 99%   BMI 38.97 kg/m   Vitals:   06/24/18 1231 06/24/18 1301 06/24/18 1330 06/24/18 1401  BP: 105/65 (!) 97/52 137/81 140/88  Pulse: 84 87 80 85  Resp: 16 16 18 18   Temp:      TempSrc:      SpO2:      Weight:      Height:         FHT:  FHR: 125s bpm, variability: moderate,  accelerations:  Present,  decelerations:  Absent UC:   regular, every 2-4 minutes SVE:   Dilation: 8 Effacement (%): 90 Station: Plus 1, 0 Exam by:: Gifford Shave RN   CBC    Component Value Date/Time   WBC 12.2 (H) 06/24/2018 0142   RBC 3.78 (L) 06/24/2018 0142   HGB 10.6 (L) 06/24/2018 0142   HCT 30.8 (L) 06/24/2018 0142   PLT 190 06/24/2018 0142   MCV 81.5 06/24/2018 0142   MCH 28.0 06/24/2018 0142   MCHC 34.4 06/24/2018 0142   RDW 14.8 06/24/2018 0142   LYMPHSABS 3.1 05/10/2017 1312   MONOABS 0.6 05/10/2017 1312   EOSABS 0.0 05/10/2017 1312   BASOSABS 0.0 05/10/2017 1312     A / P:  26 y.o.  OB History  Gravida Para Term Preterm AB Living  3 0 0 0 2 0  SAB TAB Ectopic Multiple Live Births  0 1 1 0 0   at [redacted]w[redacted]d Induction of labor due to PROM,  progressing well on pitocin  Fetal Wellbeing:  Category I Pain Control:  Epidural  Anticipated MOD:  NSVD  Lendon Colonel 06/24/2018, 2:17 PM

## 2018-06-24 NOTE — Anesthesia Preprocedure Evaluation (Signed)

## 2018-06-25 LAB — BPAM RBC
BLOOD PRODUCT EXPIRATION DATE: 201909222359
BLOOD PRODUCT EXPIRATION DATE: 201909232359
Unit Type and Rh: 9500
Unit Type and Rh: 9500

## 2018-06-25 LAB — TYPE AND SCREEN
ABO/RH(D): O NEG
Antibody Screen: POSITIVE
UNIT DIVISION: 0
Unit division: 0

## 2018-06-25 MED ORDER — RHO D IMMUNE GLOBULIN 1500 UNIT/2ML IJ SOSY
300.0000 ug | PREFILLED_SYRINGE | Freq: Once | INTRAMUSCULAR | Status: AC
  Administered 2018-06-25: 300 ug via INTRAVENOUS
  Filled 2018-06-25: qty 2

## 2018-06-25 NOTE — Lactation Note (Signed)
This note was copied from a baby's chart. Lactation Consultation Note  Patient Name: Megan Lowe [id number] Date: 06/25/2018  [facility] entered room and per mom, "I  do not want to breastfeed."  She will attend school 10/2018 and BF is not in her plan she only wants to  formula feed her baby.   Maternal Data    Feeding Feeding Type: Bottle Fed - Formula  LATCH Score                   Interventions    Lactation Tools Discussed/Used     Consult Status      Danelle Earthly 06/25/2018, 1:27 AM

## 2018-06-25 NOTE — Progress Notes (Signed)
PPD # 1 S/P NSVD Information for the patient's newborn:  Tavonna, Rulli [[id number]]  female   circumcision planned Baby name: Eli Feeding: bottle  S:  Reports feeling well.             Tolerating po/ No nausea or vomiting             Bleeding is light             Pain controlled with ibuprofen (OTC)             Up ad lib / ambulatory / voiding without difficulties   O:  A & O x 3, in no apparent distress              VS:  Vitals:   06/24/18 1910 06/24/18 2042 06/25/18 0001 06/25/18 0630  BP: 138/84 126/82 118/72 115/64  Pulse: (!) 102 84 89 83  Resp: 18 18 18 20   Temp: 97.9 F (36.6 C) 98 F (36.7 C) 97.7 F (36.5 C) 97.9 F (36.6 C)  TempSrc: Oral Oral    SpO2: 100% 100% 98% 98%  Weight:      Height:        LABS:  Recent Labs    06/24/18 0142  WBC 12.2*  HGB 10.6*  HCT 30.8*  PLT 190    Blood type: --/--/O NEG Performed at [facility], [address], [place], [place] [address]  [id number])  Rubella: Immune (02/26 0000)   I&O: I/O last 3 completed shifts: In: -  Out: 1450 [Urine:1150; Blood:300]          No intake/output data recorded.  Lungs: Clear and unlabored  Heart: regular rate and rhythm / no murmurs  Abdomen: soft, non-tender, non-distended             Fundus: firm, non-tender, U-1  Perineum: mild edema, repair intact  Lochia: small  Extremities: +1 pedal edema, no calf pain or tenderness    A/P: PPD # 1 26 y.o., [id number]   Principal Problem:   SVD 9/9 Active Problems:   Rh negative, maternal  - infant Rh positive, will need Rhogam prior to DC   Postpartum care following vaginal delivery   Second-degree perineal laceration, with delivery   Doing well - stable status  Routine post partum orders  Anticipate discharge tomorrow    Neta Mends, MSN, CNM 06/25/2018, 10:34 AM

## 2018-06-26 LAB — BIRTH TISSUE RECOVERY COLLECTION (PLACENTA DONATION)

## 2018-06-26 LAB — RH IG WORKUP (INCLUDES ABO/RH)
ABO/RH(D): O NEG
Fetal Screen: NEGATIVE
Gestational Age(Wks): 38.2
UNIT DIVISION: 0

## 2018-06-26 MED ORDER — ACETAMINOPHEN 325 MG PO TABS: 650.0000 mg | ORAL_TABLET | ORAL | 0 refills | Status: DC | PRN

## 2018-06-26 MED ORDER — IBUPROFEN 600 MG PO TABS: 600.0000 mg | ORAL_TABLET | Freq: Four times a day (QID) | ORAL | 0 refills | Status: DC

## 2018-06-26 MED ORDER — MEASLES, MUMPS & RUBELLA VAC ~~LOC~~ INJ
0.5000 mL | INJECTION | Freq: Once | SUBCUTANEOUS | Status: AC
  Administered 2018-06-26: 0.5 mL via SUBCUTANEOUS
  Filled 2018-06-26 (×5): qty 0.5

## 2018-06-26 NOTE — Discharge Summary (Signed)
  Obstetric Discharge Summary  Patient ID: Megan Lowe MRN: [id number] DOB/AGE: 1991/07/20 27 y.o.   Date of Admission: 06/24/2018  Date of Discharge:  06/26/18  Admitting Diagnosis: Premature rupture of membrane at [redacted]w[redacted]d  Secondary Diagnosis: RH negative status  Mode of Delivery: Normal spontaneous vaginal delivery     Discharge Diagnosis: No other diagnosis   Intrapartum Procedures: epidural and pitocin augmentation   Post partum procedures: rhogam  Complications: Second degree perineal laceration   Brief Hospital Course   Megan Lowe is a [id number] who had a SVD on 06/24/2018;  for further details of this birth, please refer to the delivey note.  Patient had an uncomplicated postpartum course.  By time of discharge on PPD#2, her pain was controlled on oral pain medications; she had appropriate lochia and was ambulating, voiding without difficulty and tolerating regular diet.  She was deemed stable for discharge to home.    Labs:  CBC Latest Ref Rng & Units 06/24/2018 05/10/2017 05/07/2017  WBC 4.0 - 10.5 K/uL 12.2(H) 10.7(H) -  Hemoglobin 12.0 - 15.0 g/dL 10.6(L) 12.4 12.2  Hematocrit 36.0 - 46.0 % 30.8(L) 36.1 36.0  Platelets 150 - 400 K/uL 190 270 -   O NEG  Physical exam:   Temp:  [97.8 F (36.6 C)-98.3 F (36.8 C)] 97.8 F (36.6 C) (09/11 0544) Pulse Rate:  [81-85] 81 (09/11 0544) Resp:  [18] 18 (09/11 0544) BP: (115-128)/(67-87) 115/67 (09/11 0544) SpO2:  [100 %] 100 % (09/10 2206)  General: alert and no distress  Lochia: appropriate  Abdomen: soft, NT  Uterine Fundus: firm  Perineum: healing well, no significant drainage, no dehiscence, no significant erythema  Extremities: No evidence of DVT seen on physical exam  Discharge Instructions: Per After Visit Summary.  Activity: Advance as tolerated. Pelvic rest for 6 weeks.  Also refer to After Visit Summary  Diet: Regular  Medications: Allergies as of 06/26/2018   No Known Allergies      Medication List    TAKE these medications   acetaminophen 325 MG tablet Commonly known as:  TYLENOL Take 2 tablets (650 mg total) by mouth every 4 (four) hours as needed (for pain scale < 4).   ibuprofen 600 MG tablet Commonly known as:  ADVIL,MOTRIN Take 1 tablet (600 mg total) by mouth every 6 (six) hours.   IRON PO Take 1 tablet by mouth daily.   multivitamin-prenatal 27-0.8 MG Tabs tablet Take 1 tablet by mouth daily at 12 noon.      Outpatient follow up:  Follow-up Information    Noland Fordyce, MD Follow up.   Specialty:  Obstetrics and Gynecology Why:  The office will call you to schedule a six (6) week postpartum visit with Dr. Welton Flakes information: [address] [place] [address] [phone]          Discharged Condition: stable  Discharged to: home   Newborn Data:  Disposition:home with mother  Apgars: APGAR (1 MIN): 9   APGAR (5 MINS): 9      Baby Feeding: Bottle   Gunnar Bulla, CNM [facility] 06/26/18 10:45 AM

## 2018-06-26 NOTE — Anesthesia Postprocedure Evaluation (Signed)
Anesthesia Post Note  Patient: Megan Lowe  Procedure(s) Performed: AN AD HOC LABOR EPIDURAL     Patient location during evaluation: Mother Baby Anesthesia Type: Epidural Level of consciousness: awake and alert Pain management: pain level controlled Vital Signs Assessment: post-procedure vital signs reviewed and stable Respiratory status: spontaneous breathing, nonlabored ventilation and respiratory function stable Cardiovascular status: stable Postop Assessment: no headache, no backache and epidural receding Anesthetic complications: no    Last Vitals: There were no vitals filed for this visit.  Last Pain: There were no vitals filed for this visit.               Phillips Grout

## 2018-06-26 NOTE — Discharge Instructions (Signed)
Postpartum Depression and Baby Blues The postpartum period begins right after the birth of a baby. During this time, there is often a great amount of joy and excitement. It is also a time of many changes in the life of the parents. Regardless of how many times a mother gives birth, each child brings new challenges and dynamics to the family. It is not unusual to have feelings of excitement along with confusing shifts in moods, emotions, and thoughts. All mothers are at risk of developing postpartum depression or the "baby blues." These mood changes can occur right after giving birth, or they may occur many months after giving birth. The baby blues or postpartum depression can be mild or severe. Additionally, postpartum depression can go away rather quickly, or it can be a long-term condition. What are the causes? Raised hormone levels and the rapid drop in those levels are thought to be a main cause of postpartum depression and the baby blues. A number of hormones change during and after pregnancy. Estrogen and progesterone usually decrease right after the delivery of your baby. The levels of thyroid hormone and various cortisol steroids also rapidly drop. Other factors that play a role in these mood changes include major life events and genetics. What increases the risk? If you have any of the following risks for the baby blues or postpartum depression, know what symptoms to watch out for during the postpartum period. Risk factors that may increase the likelihood of getting the baby blues or postpartum depression include:  Having a personal or family history of depression.  Having depression while being pregnant.  Having premenstrual mood issues or mood issues related to oral contraceptives.  Having a lot of life stress.  Having marital conflict.  Lacking a social support network.  Having a baby with special needs.  Having health problems, such as diabetes.  What are the signs or  symptoms? Symptoms of baby blues include:  Brief changes in mood, such as going from extreme happiness to sadness.  Decreased concentration.  Difficulty sleeping.  Crying spells, tearfulness.  Irritability.  Anxiety.  Symptoms of postpartum depression typically begin within the first month after giving birth. These symptoms include:  Difficulty sleeping or excessive sleepiness.  Marked weight loss.  Agitation.  Feelings of worthlessness.  Lack of interest in activity or food.  Postpartum psychosis is a very serious condition and can be dangerous. Fortunately, it is rare. Displaying any of the following symptoms is cause for immediate medical attention. Symptoms of postpartum psychosis include:  Hallucinations and delusions.  Bizarre or disorganized behavior.  Confusion or disorientation.  How is this diagnosed? A diagnosis is made by an evaluation of your symptoms. There are no medical or lab tests that lead to a diagnosis, but there are various questionnaires that a health care provider may use to identify those with the baby blues, postpartum depression, or psychosis. Often, a screening tool called the [place] Postnatal Depression Scale is used to diagnose depression in the postpartum period. How is this treated? The baby blues usually goes away on its own in 1-2 weeks. Social support is often all that is needed. You will be encouraged to get adequate sleep and rest. Occasionally, you may be given medicines to help you sleep. Postpartum depression requires treatment because it can last several months or longer if it is not treated. Treatment may include individual or group therapy, medicine, or both to address any social, physiological, and psychological factors that may play a role in the  depression. Regular exercise, a healthy diet, rest, and social support may also be strongly recommended. Postpartum psychosis is more serious and needs treatment right away.  Hospitalization is often needed. Follow these instructions at home:  Get as much rest as you can. Nap when the baby sleeps.  Exercise regularly. Some women find yoga and walking to be beneficial.  Eat a balanced and nourishing diet.  Do little things that you enjoy. Have a cup of tea, take a bubble bath, read your favorite magazine, or listen to your favorite music.  Avoid alcohol.  Ask for help with household chores, cooking, grocery shopping, or running errands as needed. Do not try to do everything.  Talk to people close to you about how you are feeling. Get support from your partner, family members, friends, or other new moms.  Try to stay positive in how you think. Think about the things you are grateful for.  Do not spend a lot of time alone.  Only take over-the-counter or prescription medicine as directed by your health care provider.  Keep all your postpartum appointments.  Let your health care provider know if you have any concerns. Contact a health care provider if: You are having a reaction to or problems with your medicine. Get help right away if:  You have suicidal feelings.  You think you may harm the baby or someone else. This information is not intended to replace advice given to you by your health care provider. Make sure you discuss any questions you have with your health care provider. Document Released: 07/06/2004 Document Revised: 03/09/2016 Document Reviewed: 07/14/2013 Elsevier Interactive Patient Education  2017 [employer] Vaginal Delivery, Care After Refer to this sheet in the next few weeks. These instructions provide you with information about caring for yourself after vaginal delivery. Your health care provider may also give you more specific instructions. Your treatment has been planned according to current medical practices, but problems sometimes occur. Call your health care provider if you have any problems or questions. What can I expect  after the procedure? After vaginal delivery, it is common to have:  Some bleeding from your vagina.  Soreness in your abdomen, your vagina, and the area of skin between your vaginal opening and your anus (perineum).  Pelvic cramps.  Fatigue.  Follow these instructions at home: Medicines  Take over-the-counter and prescription medicines only as told by your health care provider.  If you were prescribed an antibiotic medicine, take it as told by your health care provider. Do not stop taking the antibiotic until it is finished. Driving   Do not drive or operate heavy machinery while taking prescription pain medicine.  Do not drive for 24 hours if you received a sedative. Lifestyle  Do not drink alcohol. This is especially important if you are breastfeeding or taking medicine to relieve pain.  Do not use tobacco products, including cigarettes, chewing tobacco, or e-cigarettes. If you need help quitting, ask your health care provider. Eating and drinking  Drink at least 8 eight-ounce glasses of water every day unless you are told not to by your health care provider. If you choose to breastfeed your baby, you may need to drink more water than this.  Eat high-fiber foods every day. These foods may help prevent or relieve constipation. High-fiber foods include: ? Whole grain cereals and breads. ? Brown rice. ? Beans. ? Fresh fruits and vegetables. Activity  Return to your normal activities as told by your health care provider. Ask your  health care provider what activities are safe for you.  Rest as much as possible. Try to rest or take a nap when your baby is sleeping.  Do not lift anything that is heavier than your baby or 10 lb (4.5 kg) until your health care provider says that it is safe.  Talk with your health care provider about when you can engage in sexual activity. This may depend on your: ? Risk of infection. ? Rate of healing. ? Comfort and desire to engage in sexual  activity. Vaginal Care  If you have an episiotomy or a vaginal tear, check the area every day for signs of infection. Check for: ? More redness, swelling, or pain. ? More fluid or blood. ? Warmth. ? Pus or a bad smell.  Do not use tampons or douches until your health care provider says this is safe.  Watch for any blood clots that may pass from your vagina. These may look like clumps of dark red, brown, or black discharge. General instructions  Keep your perineum clean and dry as told by your health care provider.  Wear loose, comfortable clothing.  Wipe from front to back when you use the toilet.  Ask your health care provider if you can shower or take a bath. If you had an episiotomy or a perineal tear during labor and delivery, your health care provider may tell you not to take baths for a certain length of time.  Wear a bra that supports your breasts and fits you well.  If possible, have someone help you with household activities and help care for your baby for at least a few days after you leave the hospital.  Keep all follow-up visits for you and your baby as told by your health care provider. This is important. Contact a health care provider if:  You have: ? Vaginal discharge that has a bad smell. ? Difficulty urinating. ? Pain when urinating. ? A sudden increase or decrease in the frequency of your bowel movements. ? More redness, swelling, or pain around your episiotomy or vaginal tear. ? More fluid or blood coming from your episiotomy or vaginal tear. ? Pus or a bad smell coming from your episiotomy or vaginal tear. ? A fever. ? A rash. ? Little or no interest in activities you used to enjoy. ? Questions about caring for yourself or your baby.  Your episiotomy or vaginal tear feels warm to the touch.  Your episiotomy or vaginal tear is separating or does not appear to be healing.  Your breasts are painful, hard, or turn red.  You feel unusually sad or  worried.  You feel nauseous or you vomit.  You pass large blood clots from your vagina. If you pass a blood clot from your vagina, save it to show to your health care provider. Do not flush blood clots down the toilet without having your health care provider look at them.  You urinate more than usual.  You are dizzy or light-headed.  You have not breastfed at all and you have not had a menstrual period for 12 weeks after delivery.  You have stopped breastfeeding and you have not had a menstrual period for 12 weeks after you stopped breastfeeding. Get help right away if:  You have: ? Pain that does not go away or does not get better with medicine. ? Chest pain. ? Difficulty breathing. ? Blurred vision or spots in your vision. ? Thoughts about hurting yourself or your baby.  You  develop pain in your abdomen or in one of your legs.  You develop a severe headache.  You faint.  You bleed from your vagina so much that you fill two sanitary pads in one hour. This information is not intended to replace advice given to you by your health care provider. Make sure you discuss any questions you have with your health care provider. Document Released: 09/29/2000 Document Revised: 03/15/2016 Document Reviewed: 10/17/2015 Elsevier Interactive Patient Education  2018 [employer]. Home Care Instructions for Mom ACTIVITY  Gradually return to your regular activities.  Let yourself rest. Nap while your baby sleeps.  Avoid lifting anything that is heavier than 10 lb (4.5 kg) until your health care provider says it is okay.  Avoid activities that take a lot of effort and energy (are strenuous) until approved by your health care provider. Walking at a slow-to-moderate pace is usually safe.  If you had a cesarean delivery: ? Do not vacuum, climb stairs, or drive a car for 4-6 weeks. ? Have someone help you at home until you feel like you can do your usual activities yourself. ? Do exercises as  told by your health care provider, if this applies.  VAGINAL BLEEDING You may continue to bleed for 4-6 weeks after delivery. Over time, the amount of blood usually decreases and the color of the blood usually gets lighter. However, the flow of bright red blood may increase if you have been too active. If you need to use more than one pad in an hour because your pad gets soaked, or if you pass a large clot:  Lie down.  Raise your feet.  Place a cold compress on your lower abdomen.  Rest.  Call your health care provider.  If you are breastfeeding, your period should return anytime between 8 weeks after delivery and the time that you stop breastfeeding. If you are not breastfeeding, your period should return 6-8 weeks after delivery. PERINEAL CARE The perineal area, or perineum, is the part of your body between your thighs. After delivery, this area needs special care. Follow these instructions as told by your health care provider.  Take warm tub baths for 15-20 minutes.  Use medicated pads and pain-relieving sprays and creams as told.  Do not use tampons or douches until vaginal bleeding has stopped.  Each time you go to the bathroom: ? Use a peri bottle. ? Change your pad. ? Use towelettes in place of toilet paper until your stitches have healed.  Do Kegel exercises every day. Kegel exercises help to maintain the muscles that support the vagina, bladder, and bowels. You can do these exercises while you are standing, sitting, or lying down. To do Kegel exercises: ? Tighten the muscles of your abdomen and the muscles that surround your birth canal. ? Hold for a few seconds. ? Relax. ? Repeat until you have done this 5 times in a row.  To prevent hemorrhoids from developing or getting worse: ? Drink enough fluid to keep your urine clear or pale yellow. ? Avoid straining when having a bowel movement. ? Take over-the-counter medicines and stool softeners as told by your health care  provider.  BREAST CARE  Wear a tight-fitting bra.  Avoid taking over-the-counter pain medicine for breast discomfort.  Apply ice to the breasts to help with discomfort as needed: ? Put ice in a plastic bag. ? Place a towel between your skin and the bag. ? Leave the ice on for 20 minutes or as  told by your health care provider.  NUTRITION  Eat a well-balanced diet.  Do not try to lose weight quickly by cutting back on calories.  Take your prenatal vitamins until your postpartum checkup or until your health care provider tells you to stop.  POSTPARTUM DEPRESSION You may find yourself crying for no apparent reason and unable to cope with all of the changes that come with having a newborn. This mood is called postpartum depression. Postpartum depression happens because your hormone levels change after delivery. If you have postpartum depression, get support from your partner, friends, and family. If the depression does not go away on its own after several weeks, contact your health care provider. BREAST SELF-EXAM Do a breast self-exam each month, at the same time of the month. If you are breastfeeding, check your breasts just after a feeding, when your breasts are less full. If you are breastfeeding and your period has started, check your breasts on day 5, 6, or 7 of your period. Report any lumps, bumps, or discharge to your health care provider. Know that breasts are normally lumpy if you are breastfeeding. This is temporary, and it is not a health risk. INTIMACY AND SEXUALITY Avoid sexual activity for at least 3-4 weeks after delivery or until the brownish-red vaginal flow is completely gone. If you want to avoid pregnancy, use some form of birth control. You can get pregnant after delivery, even if you have not had your period. SEEK MEDICAL CARE IF:  You feel unable to cope with the changes that a child brings to your life, and these feelings do not go away after several weeks.  You  notice a lump, a bump, or discharge on your breast.  SEEK IMMEDIATE MEDICAL CARE IF:  Blood soaks your pad in 1 hour or less.  You have: ? Severe pain or cramping in your lower abdomen. ? A bad-smelling vaginal discharge. ? A fever that is not controlled by medicine. ? A fever, and an area of your breast is red and sore. ? Pain or redness in your calf. ? Sudden, severe chest pain. ? Shortness of breath. ? Painful or bloody urination. ? Problems with your vision.  You vomit for 12 hours or longer.  You develop a severe headache.  You have serious thoughts about hurting yourself, your child, or anyone else.  This information is not intended to replace advice given to you by your health care provider. Make sure you discuss any questions you have with your health care provider. Document Released: 09/29/2000 Document Revised: 03/09/2016 Document Reviewed: 04/05/2015 Elsevier Interactive Patient Education  2017 [employer].

## 2018-11-25 DIAGNOSIS — R6889 Other general symptoms and signs: Secondary | ICD-10-CM | POA: Diagnosis not present

## 2019-02-17 DIAGNOSIS — E669 Obesity, unspecified: Secondary | ICD-10-CM | POA: Diagnosis not present

## 2019-02-17 DIAGNOSIS — Z6835 Body mass index (BMI) 35.0-35.9, adult: Secondary | ICD-10-CM | POA: Diagnosis not present

## 2019-04-30 DIAGNOSIS — E669 Obesity, unspecified: Secondary | ICD-10-CM | POA: Diagnosis not present

## 2019-04-30 DIAGNOSIS — Z6832 Body mass index (BMI) 32.0-32.9, adult: Secondary | ICD-10-CM | POA: Diagnosis not present

## 2019-05-08 IMAGING — US US OB TRANSVAGINAL
1 series · 13 of 28 positions shown · non-contrast
Comparison: Pelvic ultrasound dated May 03, 2017.

CLINICAL DATA: Acute onset left lower quadrant pain. Patient is
pregnant, 6 weeks and 6 days based on LMP.

EXAM:
OBSTETRIC <14 WK US AND TRANSVAGINAL OB US
TECHNIQUE: Both transabdominal and transvaginal ultrasound examinations were
performed for complete evaluation of the gestation as well as the
maternal uterus, adnexal regions, and pelvic cul-de-sac.
Transvaginal technique was performed to assess early pregnancy.

[Series 1: us ob transvaginal · 0.22mm/px · 13 of 124 slices shown]
[im 5/124]
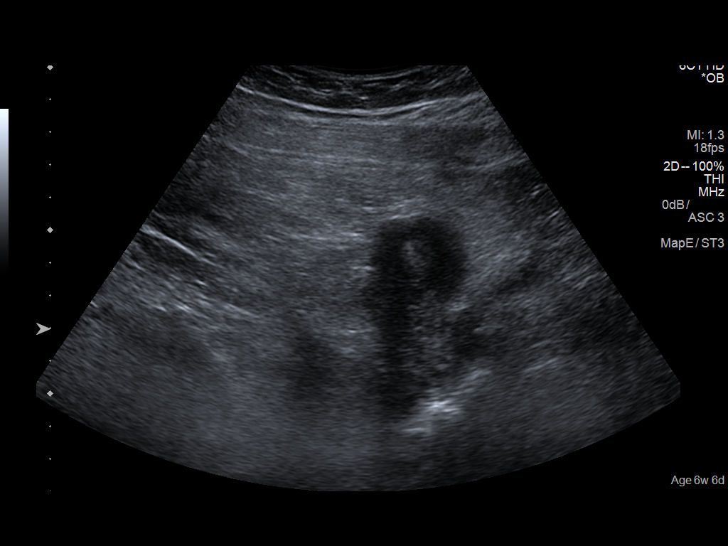
[im 14/124]
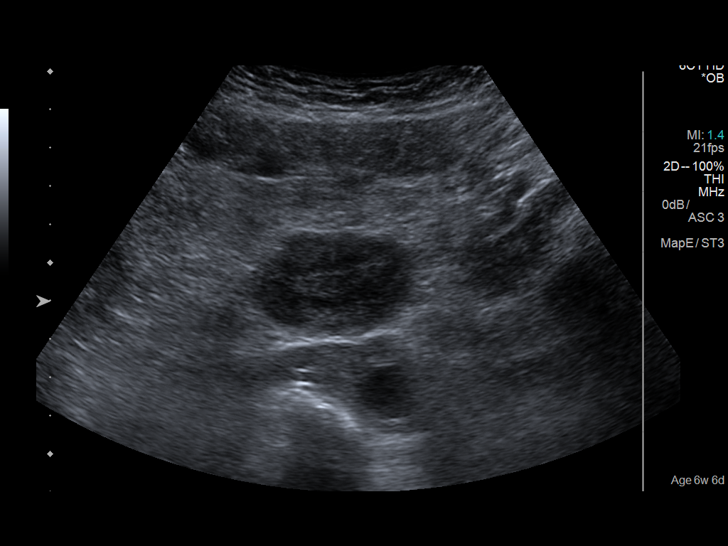
[im 23/124]
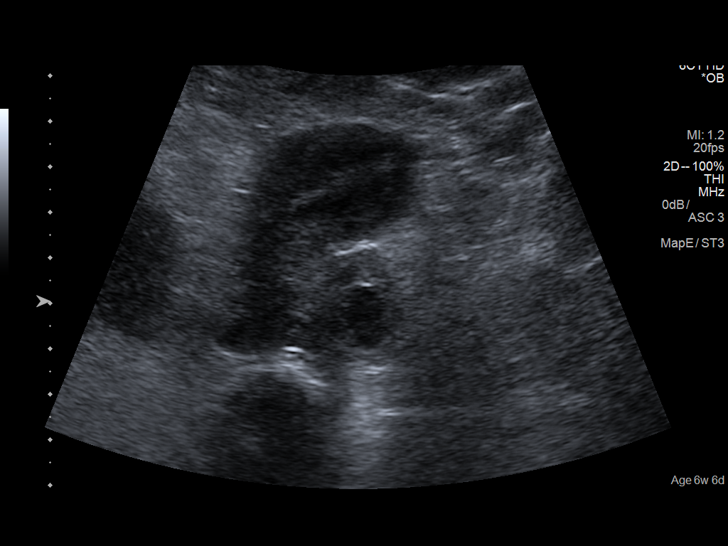
[im 32/124]
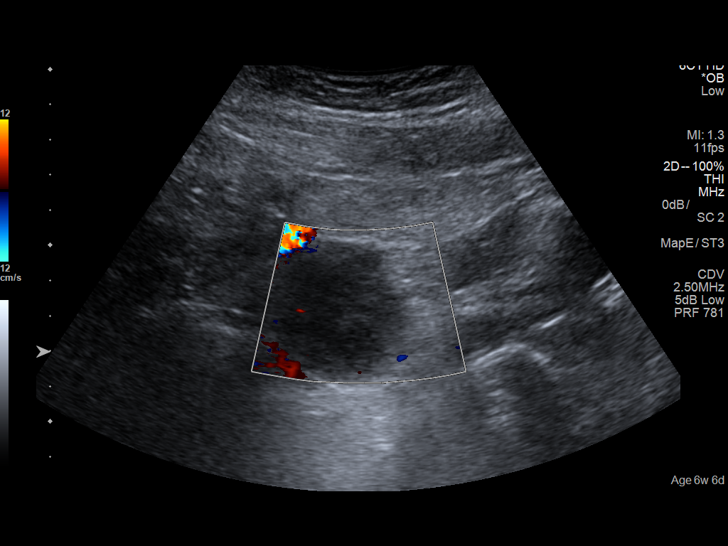
[im 42/124]
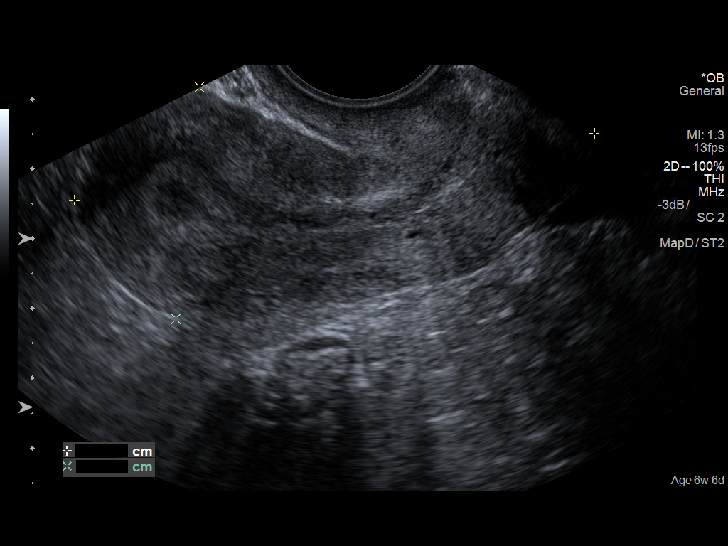
[im 51/124]
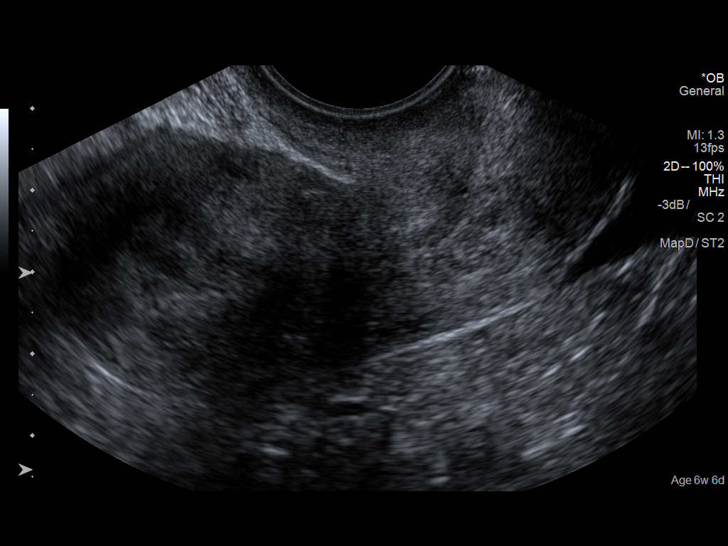
[im 64/124]
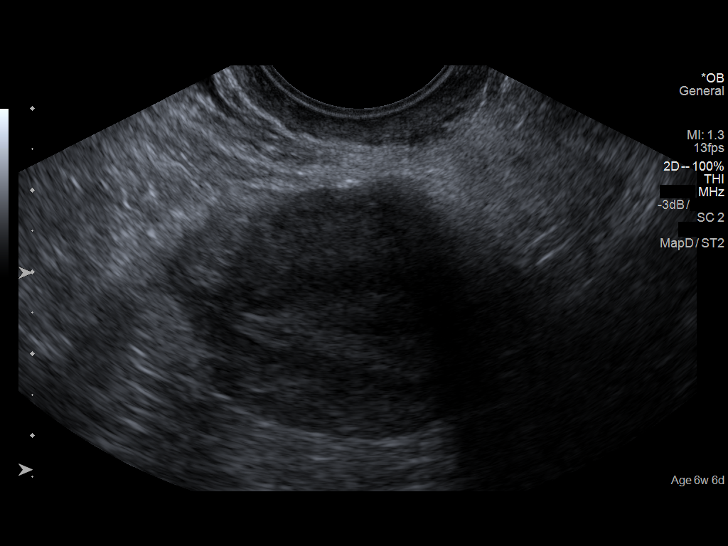
[im 73/124]
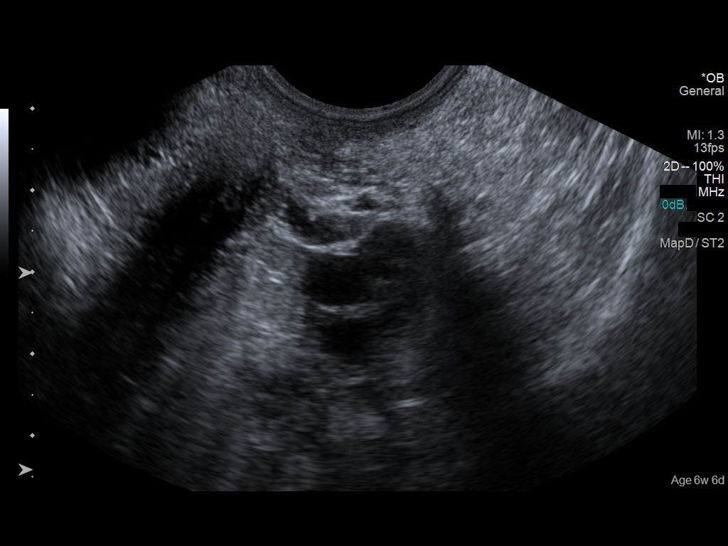
[im 83/124]
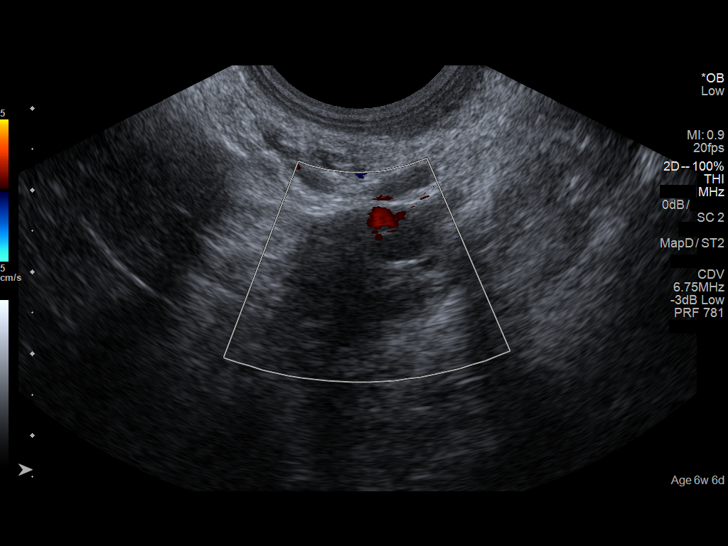
[im 92/124]
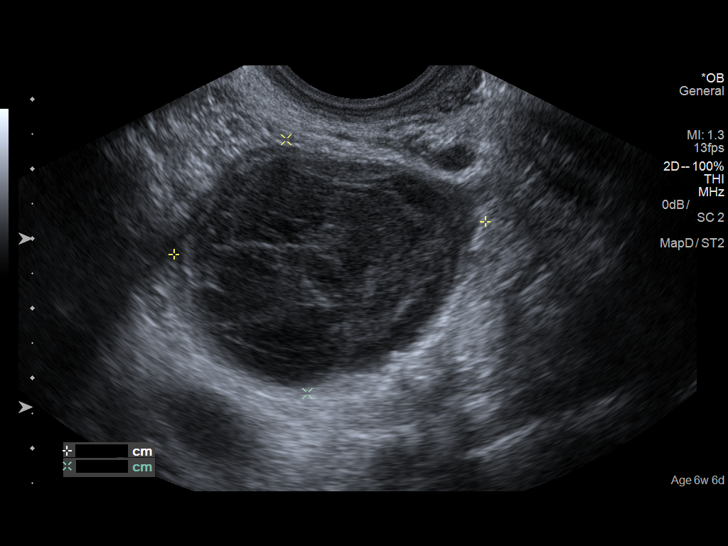
[im 101/124]
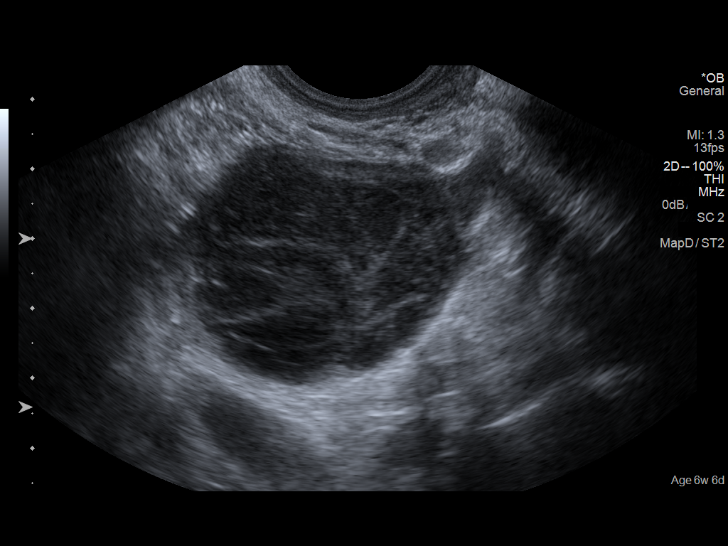
[im 110/124]
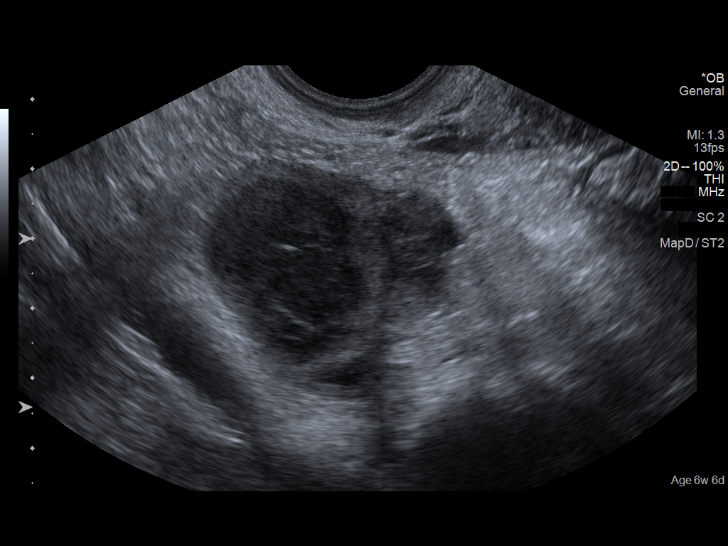
[im 119/124]
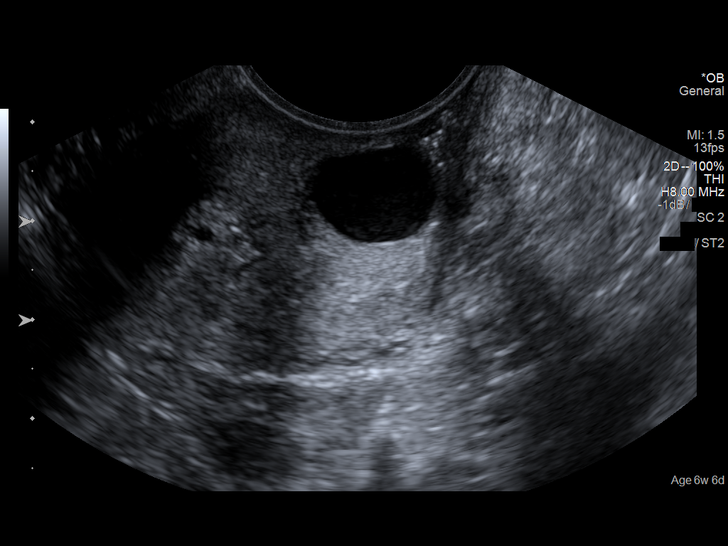

[13 of 28 positions shown; findings below may reference images not displayed]

FINDINGS: Intrauterine gestational sac: None

Yolk sac:  Not Visualized.

Embryo:  Not Visualized.

Cardiac Activity: Not Visualized.

Maternal uterus/adnexae: The uterus measures 7.5 x 3.3 x 4.5 cm.
Prominent nabothian cyst measuring up to 13 mm is unchanged. The
endometrium measures 1.2 cm.

Heterogeneous, complex right ovarian cyst measuring 3.8 x 3.0 x
cm with reticular pattern of internal echoes and no internal
vascularity. The left ovary is normal. Normal arterial and venous
flow to both ovaries. No free fluid in the pelvis.
IMPRESSION: 1. No evidence of intrauterine or ectopic pregnancy. Given estimated
gestational age by LMP and failure of beta HCG to double, findings
are suspicious but not yet definitive for failed pregnancy.
Recommend follow-up US in 6-10 days for definitive diagnosis. This
recommendation follows SRU consensus guidelines: Diagnostic Criteria
for Nonviable Pregnancy Early in the First Trimester. N Engl J Med
6493; [DATE].
2. Right ovarian hemorrhagic cyst measuring 3.8 cm.
3. No sonographic evidence of ovarian torsion.

## 2019-06-10 ENCOUNTER — Ambulatory Visit
Admission: EM | Admit: 2019-06-10 | Discharge: 2019-06-10 | Disposition: A | Discharge status: Home / Self Care | Payer: BC Managed Care – PPO | Attending: Emergency Medicine | Admitting: Emergency Medicine

## 2019-06-10 ENCOUNTER — Other Ambulatory Visit: Payer: Self-pay

## 2019-06-10 DIAGNOSIS — R110 Nausea: Secondary | ICD-10-CM

## 2019-06-10 DIAGNOSIS — R519 Headache, unspecified: Secondary | ICD-10-CM

## 2019-06-10 DIAGNOSIS — R51 Headache: Secondary | ICD-10-CM

## 2019-06-10 MED ORDER — DEXAMETHASONE SODIUM PHOSPHATE 10 MG/ML IJ SOLN
10.0000 mg | Freq: Once | INTRAMUSCULAR | Status: AC
  Administered 2019-06-10: 10 mg via INTRAMUSCULAR

## 2019-06-10 MED ORDER — KETOROLAC TROMETHAMINE 60 MG/2ML IM SOLN
60.0000 mg | Freq: Once | INTRAMUSCULAR | Status: AC
  Administered 2019-06-10: 60 mg via INTRAMUSCULAR

## 2019-06-10 MED ORDER — ONDANSETRON 4 MG PO TBDP
4.0000 mg | ORAL_TABLET | Freq: Once | ORAL | Status: AC
  Administered 2019-06-10: 4 mg via ORAL

## 2019-06-10 NOTE — Discharge Instructions (Signed)
Migraine cocktail given in office Rest and drink plenty of fluids Try taking an antihistamine for the next few weeks Follow up with PCP to establish care and for further evaluation and management of chronic migraines Return or go to the ER if you have any new or worsening symptoms such as fever, chills, nausea, vomiting, chest pain, shortness of breath, cough, vision changes, worsening headache despite treatment, slurred speech, facial asymmetry, weakness in arms or legs, etc..Marland Kitchen

## 2019-06-10 NOTE — ED Provider Notes (Signed)
[facility]   [id number] 06/10/19 Arrival Time: 1135  WU:[id number]:HEADACHE  SUBJECTIVE:  Jacklynn BarnacleHeather D Choyce is a 28 y.o. female who complains of headache that began this morning.  Denies a precipitating event, or recent head trauma.  Reports hx of sinus infections in the past, sinus HA, poor hydration, and feeling exhausted.  She also has been working 50 hrs/ week and has a 26 year old at home.  Patient localizes her pain to the frontal aspect.  Describes the pain as constant and throbbing in character.  Patient has NOT tried OTC medications. Symptoms made worse with leaning forward.  Reports similar symptoms in the past with sinus HA.  This is not the worst headache of their life.  Complains of mild ear discomfort, PND, and nausea.  Patient denies fever, chills, vomiting, sinus congestion, rhinorrhea, chest pain, SOB, abdominal pain, weakness, numbness or tingling, slurred speech.    ROS: As per HPI.  All other pertinent ROS negative.     Past Medical History:  Diagnosis Date  . Contraceptive management 07/28/2013  . Dyspareunia 11/04/2014  . Vaginal discharge 11/04/2014  . Yeast infection 11/04/2014   Past Surgical History:  Procedure Laterality Date  . NO PAST SURGERIES     No Known Allergies No current facility-administered medications on file prior to encounter.    Current Outpatient Medications on File Prior to Encounter  Medication Sig Dispense Refill  . acetaminophen (TYLENOL) 325 MG tablet Take 2 tablets (650 mg total) by mouth every 4 (four) hours as needed (for pain scale < 4). 60 tablet 0  . ibuprofen (ADVIL,MOTRIN) 600 MG tablet Take 1 tablet (600 mg total) by mouth every 6 (six) hours. 30 tablet 0  . IRON PO Take 1 tablet by mouth daily.      Social History   Socioeconomic History  . Marital status: Married    Spouse name: Not on file  . Number of children: Not on file  . Years of education: Not on file  . Highest education level: Not on file  Occupational History   . Not on file  Social Needs  . Financial resource strain: Not hard at all  . Food insecurity    Worry: Never true    Inability: Never true  . Transportation needs    Medical: No    Non-medical: No  Tobacco Use  . Smoking status: Never Smoker  . Smokeless tobacco: Never Used  Substance and Sexual Activity  . Alcohol use: No  . Drug use: No  . Sexual activity: Yes    Birth control/protection: None  Lifestyle  . Physical activity    Days per week: 2 days    Minutes per session: 20 min  . Stress: Not at all  Relationships  . Social Musicianconnections    Talks on phone: Three times a week    Gets together: Three times a week    Attends religious service: More than 4 times per year    Active member of club or organization: Yes    Attends meetings of clubs or organizations: More than 4 times per year    Relationship status: Married  . Intimate partner violence    Fear of current or ex partner: No    Emotionally abused: No    Physically abused: No    Forced sexual activity: No  Other Topics Concern  . Not on file  Social History Narrative  . Not on file   Family History  Problem Relation Age of  Onset  . Hypertension Mother   . Cancer Paternal Grandfather        skin   . Anxiety disorder Sister   . Depression Sister   . Pulmonary embolism Sister   . Other Paternal Grandmother        wears a heart monitor  . Hypertension Maternal Grandmother     OBJECTIVE:  Vitals:   06/10/19 1156  BP: 114/78  Pulse: 88  Resp: 20  Temp: 98 F (36.7 C)  SpO2: 98%    General appearance: alert; no distress Eyes: PERRLA; EOMI grossly HENT: normocephalic; atraumatic; mildly TTP over frontal aspect of head; nares patent without rhinorrhea, oropharynx clear Neck: supple with FROM Lungs: clear to auscultation bilaterally Heart: regular rate and rhythm.   Extremities: no edema; symmetrical with no gross deformities Skin: warm and dry Neurologic: CN 2-12 grossly intact; normal gait;  strength and sensation intact bilaterally about the upper and lower extremities Psychological: alert and cooperative; normal mood and affect  ASSESSMENT & PLAN:  1. Acute nonintractable headache, unspecified headache type   2. Nausea without vomiting     Meds ordered this encounter  Medications  . ketorolac (TORADOL) injection 60 mg  . dexamethasone (DECADRON) injection 10 mg  . ondansetron (ZOFRAN-ODT) disintegrating tablet 4 mg    Migraine cocktail given in office Rest and drink plenty of fluids Try taking an antihistamine for the next few weeks Follow up with PCP to establish care and for further evaluation and management of chronic migraines Return or go to the ER if you have any new or worsening symptoms such as fever, chills, nausea, vomiting, chest pain, shortness of breath, cough, vision changes, worsening headache despite treatment, slurred speech, facial asymmetry, weakness in arms or legs, etc...  Reviewed expectations re: course of current medical issues. Questions answered. Outlined signs and symptoms indicating need for more acute intervention. Patient verbalized understanding. After Visit Summary given.   Lestine Box, PA-C 06/10/19 1240

## 2019-06-10 NOTE — ED Triage Notes (Signed)
Has been getting migraines every 2 weeks for past 3 months

## 2019-07-01 DIAGNOSIS — N898 Other specified noninflammatory disorders of vagina: Secondary | ICD-10-CM | POA: Diagnosis not present

## 2019-07-01 DIAGNOSIS — Z113 Encounter for screening for infections with a predominantly sexual mode of transmission: Secondary | ICD-10-CM | POA: Diagnosis not present

## 2019-07-01 DIAGNOSIS — Z118 Encounter for screening for other infectious and parasitic diseases: Secondary | ICD-10-CM | POA: Diagnosis not present

## 2019-07-01 DIAGNOSIS — B373 Candidiasis of vulva and vagina: Secondary | ICD-10-CM | POA: Diagnosis not present

## 2019-07-01 DIAGNOSIS — Z6833 Body mass index (BMI) 33.0-33.9, adult: Secondary | ICD-10-CM | POA: Diagnosis not present

## 2019-07-01 DIAGNOSIS — Z01419 Encounter for gynecological examination (general) (routine) without abnormal findings: Secondary | ICD-10-CM | POA: Diagnosis not present

## 2019-07-09 ENCOUNTER — Ambulatory Visit
Admission: EM | Admit: 2019-07-09 | Discharge: 2019-07-09 | Disposition: A | Discharge status: Home / Self Care | Payer: BC Managed Care – PPO | Attending: Emergency Medicine | Admitting: Emergency Medicine

## 2019-07-09 ENCOUNTER — Other Ambulatory Visit: Payer: Self-pay

## 2019-07-09 DIAGNOSIS — J3489 Other specified disorders of nose and nasal sinuses: Secondary | ICD-10-CM

## 2019-07-09 DIAGNOSIS — H9203 Otalgia, bilateral: Secondary | ICD-10-CM

## 2019-07-09 DIAGNOSIS — H938X3 Other specified disorders of ear, bilateral: Secondary | ICD-10-CM

## 2019-07-09 MED ORDER — PREDNISONE 20 MG PO TABS: 20.0000 mg | ORAL_TABLET | Freq: Two times a day (BID) | ORAL | 0 refills | Status: AC

## 2019-07-09 NOTE — ED Provider Notes (Signed)
[facility]   [id number] 07/09/19 Arrival Time: 1621  CC: EAR PAIN; sinus pressure  SUBJECTIVE: History from: patient.  Megan Lowe is a 28 y.o. female who presents with bilateral ear pressure, discomfort, and frontal sinus pressure x 1 day.  Denies a precipitating event, such as swimming or wearing ear plugs.  Denies sick exposure to COVID, flu or strep.  Denies recent travel. Attributes symptoms to weather change. Patient states the ear discomfort is constant and achy in character.  Patient has tried zyrtec without relief.  Symptoms are made worse with lying down.  Reports similar symptoms in the past related to ear infections.  Complains of associated PND.  Denies fever, chills, fatigue, rhinorrhea, ear discharge, sore throat, SOB, cough, wheezing, chest pain, nausea, vomiting, changes in bowel or bladder habits.    ROS: As per HPI.  All other pertinent ROS negative.     Past Medical History:  Diagnosis Date  . Contraceptive management 07/28/2013  . Dyspareunia 11/04/2014  . Vaginal discharge 11/04/2014  . Yeast infection 11/04/2014   Past Surgical History:  Procedure Laterality Date  . NO PAST SURGERIES     No Known Allergies No current facility-administered medications on file prior to encounter.    Current Outpatient Medications on File Prior to Encounter  Medication Sig Dispense Refill  . acetaminophen (TYLENOL) 325 MG tablet Take 2 tablets (650 mg total) by mouth every 4 (four) hours as needed (for pain scale < 4). 60 tablet 0  . ibuprofen (ADVIL,MOTRIN) 600 MG tablet Take 1 tablet (600 mg total) by mouth every 6 (six) hours. 30 tablet 0  . IRON PO Take 1 tablet by mouth daily.      Social History   Socioeconomic History  . Marital status: Married    Spouse name: Not on file  . Number of children: Not on file  . Years of education: Not on file  . Highest education level: Not on file  Occupational History  . Not on file  Social Needs  . Financial  resource strain: Not hard at all  . Food insecurity    Worry: Never true    Inability: Never true  . Transportation needs    Medical: No    Non-medical: No  Tobacco Use  . Smoking status: Never Smoker  . Smokeless tobacco: Never Used  Substance and Sexual Activity  . Alcohol use: No  . Drug use: No  . Sexual activity: Yes    Birth control/protection: None  Lifestyle  . Physical activity    Days per week: 2 days    Minutes per session: 20 min  . Stress: Not at all  Relationships  . Social Musician on phone: Three times a week    Gets together: Three times a week    Attends religious service: More than 4 times per year    Active member of club or organization: Yes    Attends meetings of clubs or organizations: More than 4 times per year    Relationship status: Married  . Intimate partner violence    Fear of current or ex partner: No    Emotionally abused: No    Physically abused: No    Forced sexual activity: No  Other Topics Concern  . Not on file  Social History Narrative  . Not on file   Family History  Problem Relation Age of Onset  . Hypertension Mother   . Cancer Paternal Grandfather  skin   . Anxiety disorder Sister   . Depression Sister   . Pulmonary embolism Sister   . Other Paternal Grandmother        wears a heart monitor  . Hypertension Maternal Grandmother     OBJECTIVE:  Vitals:   07/09/19 1630  BP: 116/81  Pulse: 86  Resp: 20  Temp: 98.3 F (36.8 C)  SpO2: 98%    General appearance: alert; well-appearing, nontoxic HEENT: NCAT; Ears: EACs clear, TMs pearly gray with visible cone of light, without erythema; Eyes: PERRL, EOMI grossly; Nose: nares patent without rhinorrhea; Throat: oropharynx clear, tonsils not enlarged or erythematous, without white tonsillar exudates, uvula midline Neck: supple without LAD Lungs: unlabored respirations, symmetrical air entry; cough: absent; no respiratory distress Heart: regular rate and  rhythm.  Radial pulses 2+ symmetrical bilaterally Skin: warm and dry Psychological: alert and cooperative; normal mood and affect  ASSESSMENT & PLAN:  1. Sinus pressure   2. Pressure sensation in both ears     Meds ordered this encounter  Medications  . predniSONE (DELTASONE) 20 MG tablet    Sig: Take 1 tablet (20 mg total) by mouth 2 (two) times daily with a meal for 5 days.    Dispense:  10 tablet    Refill:  0    Order Specific Question:   Supervising Provider    Answer:   Raylene Everts [[id number]]   Rest and push fluids Prednisone prescribed.  Take as directed and to completion Continue with antihistamine like zyrtec, allegra or claritin daily Continue with flonase daily Use OTC ibuprofen and/ or tylenol as needed for pain control Follow up with PCP if symptoms persists Return here or go to the ER if you have any new or worsening symptoms fever, chills, nausea, vomiting, cough, runny nose, congestion, sore throat, body aches, chills, persistent symptoms despite medications, etc...  Reviewed expectations re: course of current medical issues. Questions answered. Outlined signs and symptoms indicating need for more acute intervention. Patient verbalized understanding. After Visit Summary given.         Lestine Box, PA-C 07/09/19 1648

## 2019-07-09 NOTE — ED Triage Notes (Signed)
Pt had sinus symptoms yesterday, woke up with ear pain this morning

## 2019-07-09 NOTE — Discharge Instructions (Signed)
Rest and push fluids Prednisone prescribed.  Take as directed and to completion Continue with antihistamine like zyrtec, allegra or claritin daily Continue with flonase daily Use OTC ibuprofen and/ or tylenol as needed for pain control Follow up with PCP if symptoms persists Return here or go to the ER if you have any new or worsening symptoms fever, chills, nausea, vomiting, cough, runny nose, congestion, sore throat, body aches, chills, persistent symptoms despite medications, etc..Marland Kitchen

## 2019-09-07 DIAGNOSIS — Z20828 Contact with and (suspected) exposure to other viral communicable diseases: Secondary | ICD-10-CM | POA: Diagnosis not present

## 2019-10-17 NOTE — L&D Delivery Note (Addendum)
Delivery Note:   [id number] at [redacted]w[redacted]d  Admitting diagnosis: Normal labor [O80, Z37.9] Risks: None Onset of labor: 10/20 @ 1700 IOL/Augmentation: AROM  ROM: AROM clear fluid @ 0005  Complete dilation at 08/05/2020  0112 Onset of pushing at 0112 FHR second stage Cat 2, variable decelerations  Analgesia /Anesthesia intrapartum:Epidural  Pushing in side lying position position with CNM and L&D staff support, Domenic Schwab, spouse, and mother, Fredia Sorrow, present for birth and supportive.  Delivery of a Live born female  Birth Weight:  3646gms, 8lb 0.6oz APGAR: 8, 9  Newborn Delivery   Birth date/time: 08/05/2020 01:26:00 Delivery type: Vaginal, Spontaneous      in cephalic presentation, position OA to LOA.  APGAR:1 min-8 , 5 min-9   Nuchal Cord: No  Cord double clamped after cessation of pulsation, cut by FOB, Donovan.  Collection of cord blood for typing completed. Cord blood donation-None  Arterial cord blood sample-No    Placenta delivered-Spontaneous  with 3 vessels . Uterotonics: Pitocin Placenta to L7D Uterine tone firm  Bleeding initially brisk with boggy tone. Started IV Pitocin and performed fundal massage. Bleeding stabilized.   Approximately 50 minutes post delivery, CNM was notified by RN that uterus was firm but there was a small trickle of blood with each fundal rub. Inspection revealed intact laceration and firm lower uterine segment. Bleeding continued to be slow trickle. Manual exploration of uterus revealed no remaining placental fragments or amnion. TXA and methergine given. Bleeding stabilized. Total EBL 300cc. Will give 3gm Unasyn d/t manual exploration.   2nd degree  laceration identified.  Episiotomy:None  Local analgesia: N/A  Repair: 2-0 Vicryl in standard fashion with excellent hemostasis Est. Blood Loss (mL):200.00   Complications: None  Mom to postpartum.  Baby Carson to Couplet care / Skin to Skin.  Delivery Report:   Review the Delivery Report for  details.    June Leap, CNM, MSN 08/05/2020, 2:13 AM   Tracing reviewed. PP VS stable.  PP CBC stable.

## 2019-12-08 DIAGNOSIS — Z3200 Encounter for pregnancy test, result unknown: Secondary | ICD-10-CM | POA: Diagnosis not present

## 2019-12-08 DIAGNOSIS — Z3A01 Less than 8 weeks gestation of pregnancy: Secondary | ICD-10-CM | POA: Diagnosis not present

## 2019-12-08 DIAGNOSIS — Z3689 Encounter for other specified antenatal screening: Secondary | ICD-10-CM | POA: Diagnosis not present

## 2019-12-08 DIAGNOSIS — O0911 Supervision of pregnancy with history of ectopic or molar pregnancy, first trimester: Secondary | ICD-10-CM | POA: Diagnosis not present

## 2019-12-10 DIAGNOSIS — Z3A01 Less than 8 weeks gestation of pregnancy: Secondary | ICD-10-CM | POA: Diagnosis not present

## 2019-12-10 DIAGNOSIS — O0911 Supervision of pregnancy with history of ectopic or molar pregnancy, first trimester: Secondary | ICD-10-CM | POA: Diagnosis not present

## 2019-12-18 DIAGNOSIS — Z3201 Encounter for pregnancy test, result positive: Secondary | ICD-10-CM | POA: Diagnosis not present

## 2020-01-02 DIAGNOSIS — O3680X0 Pregnancy with inconclusive fetal viability, not applicable or unspecified: Secondary | ICD-10-CM | POA: Diagnosis not present

## 2020-01-02 DIAGNOSIS — Z3A01 Less than 8 weeks gestation of pregnancy: Secondary | ICD-10-CM | POA: Diagnosis not present

## 2020-01-28 DIAGNOSIS — O0910 Supervision of pregnancy with history of ectopic or molar pregnancy, unspecified trimester: Secondary | ICD-10-CM | POA: Diagnosis not present

## 2020-01-28 DIAGNOSIS — Z3A11 11 weeks gestation of pregnancy: Secondary | ICD-10-CM | POA: Diagnosis not present

## 2020-01-28 DIAGNOSIS — Z3689 Encounter for other specified antenatal screening: Secondary | ICD-10-CM | POA: Diagnosis not present

## 2020-01-28 LAB — OB RESULTS CONSOLE RUBELLA ANTIBODY, IGM: Rubella: IMMUNE

## 2020-01-28 LAB — OB RESULTS CONSOLE HEPATITIS B SURFACE ANTIGEN: Hepatitis B Surface Ag: NEGATIVE

## 2020-01-28 LAB — OB RESULTS CONSOLE ABO/RH: RH Type: NEGATIVE

## 2020-01-28 LAB — OB RESULTS CONSOLE HIV ANTIBODY (ROUTINE TESTING): HIV: NONREACTIVE

## 2020-01-28 LAB — OB RESULTS CONSOLE ANTIBODY SCREEN: Antibody Screen: NEGATIVE

## 2020-01-28 LAB — OB RESULTS CONSOLE RPR: RPR: NONREACTIVE

## 2020-02-27 DIAGNOSIS — Z3A15 15 weeks gestation of pregnancy: Secondary | ICD-10-CM | POA: Diagnosis not present

## 2020-02-27 DIAGNOSIS — O0912 Supervision of pregnancy with history of ectopic or molar pregnancy, second trimester: Secondary | ICD-10-CM | POA: Diagnosis not present

## 2020-03-26 DIAGNOSIS — Z361 Encounter for antenatal screening for raised alphafetoprotein level: Secondary | ICD-10-CM | POA: Diagnosis not present

## 2020-03-26 DIAGNOSIS — O0912 Supervision of pregnancy with history of ectopic or molar pregnancy, second trimester: Secondary | ICD-10-CM | POA: Diagnosis not present

## 2020-03-26 DIAGNOSIS — Z363 Encounter for antenatal screening for malformations: Secondary | ICD-10-CM | POA: Diagnosis not present

## 2020-03-26 DIAGNOSIS — Z3A19 19 weeks gestation of pregnancy: Secondary | ICD-10-CM | POA: Diagnosis not present

## 2020-04-16 DIAGNOSIS — O0912 Supervision of pregnancy with history of ectopic or molar pregnancy, second trimester: Secondary | ICD-10-CM | POA: Diagnosis not present

## 2020-04-16 DIAGNOSIS — Z3A22 22 weeks gestation of pregnancy: Secondary | ICD-10-CM | POA: Diagnosis not present

## 2020-04-16 DIAGNOSIS — Z362 Encounter for other antenatal screening follow-up: Secondary | ICD-10-CM | POA: Diagnosis not present

## 2020-05-14 DIAGNOSIS — Z3689 Encounter for other specified antenatal screening: Secondary | ICD-10-CM | POA: Diagnosis not present

## 2020-05-14 DIAGNOSIS — Z3A26 26 weeks gestation of pregnancy: Secondary | ICD-10-CM | POA: Diagnosis not present

## 2020-05-14 DIAGNOSIS — O0910 Supervision of pregnancy with history of ectopic or molar pregnancy, unspecified trimester: Secondary | ICD-10-CM | POA: Diagnosis not present

## 2020-05-20 ENCOUNTER — Ambulatory Visit
Admission: EM | Admit: 2020-05-20 | Discharge: 2020-05-20 | Disposition: A | Discharge status: Home / Self Care | Payer: BC Managed Care – PPO | Attending: Emergency Medicine | Admitting: Emergency Medicine

## 2020-05-20 ENCOUNTER — Other Ambulatory Visit: Payer: Self-pay

## 2020-05-20 DIAGNOSIS — J069 Acute upper respiratory infection, unspecified: Secondary | ICD-10-CM

## 2020-05-20 MED ORDER — GUAIFENESIN 100 MG/5ML PO LIQD: 100.0000 mg | ORAL | 0 refills | Status: DC | PRN

## 2020-05-20 MED ORDER — FLUTICASONE PROPIONATE 50 MCG/ACT NA SUSP: 1.0000 | Freq: Every day | NASAL | 0 refills | Status: DC

## 2020-05-20 NOTE — ED Triage Notes (Signed)
Pt presents with c/o nasal congestion for past couple of days

## 2020-05-20 NOTE — ED Provider Notes (Signed)
[facility]   [id number] 05/20/20 Arrival Time: 0944   Chief Complaint  Patient presents with  . Nasal Congestion     SUBJECTIVE: History from: patient.  Megan Lowe is a 29 y.o. female weeks pregnant presented to the urgent care for complaint of sinus pressure, nasal congestion, cough and postnasal drip for the past few days.  Reports clear nasal discharge.  Denies sick exposure to COVID, flu or strep.  Denies recent travel.  Has not tried any OTC medication.  Denies alleviating or aggravating factors.  Denies  previous symptoms in the past.   Denies fever, chills, fatigue, sinus pain, rhinorrhea, sore throat, SOB, wheezing, chest pain, nausea, changes in bowel or bladder habits.     ROS: As per HPI.  All other pertinent ROS negative.      Past Medical History:  Diagnosis Date  . Contraceptive management 07/28/2013  . Dyspareunia 11/04/2014  . Vaginal discharge 11/04/2014  . Yeast infection 11/04/2014   Past Surgical History:  Procedure Laterality Date  . NO PAST SURGERIES     No Known Allergies No current facility-administered medications on file prior to encounter.   Current Outpatient Medications on File Prior to Encounter  Medication Sig Dispense Refill  . acetaminophen (TYLENOL) 325 MG tablet Take 2 tablets (650 mg total) by mouth every 4 (four) hours as needed (for pain scale < 4). 60 tablet 0  . ibuprofen (ADVIL,MOTRIN) 600 MG tablet Take 1 tablet (600 mg total) by mouth every 6 (six) hours. 30 tablet 0  . IRON PO Take 1 tablet by mouth daily.      Social History   Socioeconomic History  . Marital status: Married    Spouse name: Not on file  . Number of children: Not on file  . Years of education: Not on file  . Highest education level: Not on file  Occupational History  . Not on file  Tobacco Use  . Smoking status: Never Smoker  . Smokeless tobacco: Never Used  Substance and Sexual Activity  . Alcohol use: No  . Drug use: No  . Sexual  activity: Yes    Birth control/protection: None  Other Topics Concern  . Not on file  Social History Narrative  . Not on file   Social Determinants of Health   Financial Resource Strain:   . Difficulty of Paying Living Expenses:   Food Insecurity:   . Worried About Programme researcher, broadcasting/film/video in the Last Year:   . Barista in the Last Year:   Transportation Needs:   . Freight forwarder (Medical):   Marland Kitchen Lack of Transportation (Non-Medical):   Physical Activity:   . Days of Exercise per Week:   . Minutes of Exercise per Session:   Stress:   . Feeling of Stress :   Social Connections:   . Frequency of Communication with Friends and Family:   . Frequency of Social Gatherings with Friends and Family:   . Attends Religious Services:   . Active Member of Clubs or Organizations:   . Attends Banker Meetings:   Marland Kitchen Marital Status:   Intimate Partner Violence:   . Fear of Current or Ex-Partner:   . Emotionally Abused:   Marland Kitchen Physically Abused:   . Sexually Abused:    Family History  Problem Relation Age of Onset  . Hypertension Mother   . Cancer Paternal Grandfather        skin   . Anxiety disorder Sister   .  Depression Sister   . Pulmonary embolism Sister   . Other Paternal Grandmother        wears a heart monitor  . Hypertension Maternal Grandmother     OBJECTIVE:  Vitals:   05/20/20 0952  BP: 107/72  Pulse: (!) 118  Resp: 16  Temp: 98.3 F (36.8 C)  TempSrc: Oral  SpO2: 99%     General appearance: alert; appears fatigued, but nontoxic; speaking in full sentences and tolerating own secretions HEENT: NCAT; Ears: EACs clear, TMs pearly gray; Eyes: PERRL.  EOM grossly intact. Sinuses: nontender; Nose: nares patent without rhinorrhea, Throat: oropharynx clear, tonsils non erythematous or enlarged, uvula midline  Neck: supple without LAD Lungs: unlabored respirations, symmetrical air entry; cough: mild; no respiratory distress; CTAB Heart: regular rate  and rhythm.  Radial pulses 2+ symmetrical bilaterally Skin: warm and dry Psychological: alert and cooperative; normal mood and affect  LABS:  No results found for this or any previous visit (from the past 24 hour(s)).   ASSESSMENT & PLAN:  1. URI with cough and congestion     Meds ordered this encounter  Medications  . fluticasone (FLONASE) 50 MCG/ACT nasal spray    Sig: Place 1 spray into both nostrils daily for 14 days.    Dispense:  16 g    Refill:  0  . guaiFENesin (ROBITUSSIN) 100 MG/5ML liquid    Sig: Take 5-10 mLs (100-200 mg total) by mouth every 4 (four) hours as needed for cough.    Dispense:  118 mL    Refill:  0   Discharge Instructions  Get plenty of rest and push fluids Use  flonase for nasal congestion and runny nose Guaifenesin prescribed May use OTC Coricidin HBP for cough and congestion Follow-up with OB/GYN Use medications daily for symptom relief Use OTC medications like ibuprofen or tylenol as needed fever or pain Call or go to the ED if you have any new or worsening symptoms such as fever, worsening cough, shortness of breath, chest tightness, chest pain, turning blue, changes in mental status, etc...   Reviewed expectations re: course of current medical issues. Questions answered. Outlined signs and symptoms indicating need for more acute intervention. Patient verbalized understanding. After Visit Summary given.      Note: This document was prepared using Dragon voice recognition software and may include unintentional dictation errors.    Durward Parcel, FNP 05/20/20 1032

## 2020-05-20 NOTE — Discharge Instructions (Signed)
Get plenty of rest and push fluids Use  flonase for nasal congestion and runny nose Guaifenesin prescribed May use OTC Coricidin HBP for cough and congestion Follow-up with OB/GYN Use medications daily for symptom relief Use OTC medications like ibuprofen or tylenol as needed fever or pain Call or go to the ED if you have any new or worsening symptoms such as fever, worsening cough, shortness of breath, chest tightness, chest pain, turning blue, changes in mental status, etc..

## 2020-05-28 DIAGNOSIS — O360130 Maternal care for anti-D [Rh] antibodies, third trimester, not applicable or unspecified: Secondary | ICD-10-CM | POA: Diagnosis not present

## 2020-05-28 DIAGNOSIS — Z3A28 28 weeks gestation of pregnancy: Secondary | ICD-10-CM | POA: Diagnosis not present

## 2020-05-28 DIAGNOSIS — J329 Chronic sinusitis, unspecified: Secondary | ICD-10-CM | POA: Diagnosis not present

## 2020-06-01 DIAGNOSIS — S93412A Sprain of calcaneofibular ligament of left ankle, initial encounter: Secondary | ICD-10-CM | POA: Diagnosis not present

## 2020-06-01 DIAGNOSIS — M25572 Pain in left ankle and joints of left foot: Secondary | ICD-10-CM | POA: Diagnosis not present

## 2020-06-04 DIAGNOSIS — O360130 Maternal care for anti-D [Rh] antibodies, third trimester, not applicable or unspecified: Secondary | ICD-10-CM | POA: Diagnosis not present

## 2020-06-04 DIAGNOSIS — Z3689 Encounter for other specified antenatal screening: Secondary | ICD-10-CM | POA: Diagnosis not present

## 2020-06-04 DIAGNOSIS — Z3A29 29 weeks gestation of pregnancy: Secondary | ICD-10-CM | POA: Diagnosis not present

## 2020-06-04 DIAGNOSIS — Z23 Encounter for immunization: Secondary | ICD-10-CM | POA: Diagnosis not present

## 2020-06-17 DIAGNOSIS — Z3A31 31 weeks gestation of pregnancy: Secondary | ICD-10-CM | POA: Diagnosis not present

## 2020-06-17 DIAGNOSIS — O43193 Other malformation of placenta, third trimester: Secondary | ICD-10-CM | POA: Diagnosis not present

## 2020-06-17 DIAGNOSIS — Z7189 Other specified counseling: Secondary | ICD-10-CM | POA: Diagnosis not present

## 2020-06-17 DIAGNOSIS — O99013 Anemia complicating pregnancy, third trimester: Secondary | ICD-10-CM | POA: Diagnosis not present

## 2020-07-02 DIAGNOSIS — N898 Other specified noninflammatory disorders of vagina: Secondary | ICD-10-CM | POA: Diagnosis not present

## 2020-07-02 DIAGNOSIS — O26893 Other specified pregnancy related conditions, third trimester: Secondary | ICD-10-CM | POA: Diagnosis not present

## 2020-07-02 DIAGNOSIS — O3663X0 Maternal care for excessive fetal growth, third trimester, not applicable or unspecified: Secondary | ICD-10-CM | POA: Diagnosis not present

## 2020-07-02 DIAGNOSIS — B373 Candidiasis of vulva and vagina: Secondary | ICD-10-CM | POA: Diagnosis not present

## 2020-07-02 DIAGNOSIS — Z7189 Other specified counseling: Secondary | ICD-10-CM | POA: Diagnosis not present

## 2020-07-02 DIAGNOSIS — Z3A33 33 weeks gestation of pregnancy: Secondary | ICD-10-CM | POA: Diagnosis not present

## 2020-07-16 DIAGNOSIS — O3663X0 Maternal care for excessive fetal growth, third trimester, not applicable or unspecified: Secondary | ICD-10-CM | POA: Diagnosis not present

## 2020-07-16 DIAGNOSIS — Z3685 Encounter for antenatal screening for Streptococcus B: Secondary | ICD-10-CM | POA: Diagnosis not present

## 2020-07-16 DIAGNOSIS — Z3A35 35 weeks gestation of pregnancy: Secondary | ICD-10-CM | POA: Diagnosis not present

## 2020-07-16 DIAGNOSIS — O99013 Anemia complicating pregnancy, third trimester: Secondary | ICD-10-CM | POA: Diagnosis not present

## 2020-07-16 LAB — OB RESULTS CONSOLE GBS: GBS: NEGATIVE

## 2020-07-23 DIAGNOSIS — Z3A36 36 weeks gestation of pregnancy: Secondary | ICD-10-CM | POA: Diagnosis not present

## 2020-07-23 DIAGNOSIS — O99013 Anemia complicating pregnancy, third trimester: Secondary | ICD-10-CM | POA: Diagnosis not present

## 2020-07-29 DIAGNOSIS — O99013 Anemia complicating pregnancy, third trimester: Secondary | ICD-10-CM | POA: Diagnosis not present

## 2020-07-29 DIAGNOSIS — Z3A37 37 weeks gestation of pregnancy: Secondary | ICD-10-CM | POA: Diagnosis not present

## 2020-08-03 ENCOUNTER — Encounter (HOSPITAL_COMMUNITY): Payer: Self-pay | Admitting: *Deleted

## 2020-08-03 ENCOUNTER — Telehealth (HOSPITAL_COMMUNITY): Payer: Self-pay | Admitting: *Deleted

## 2020-08-03 DIAGNOSIS — Z3A37 37 weeks gestation of pregnancy: Secondary | ICD-10-CM | POA: Diagnosis not present

## 2020-08-03 DIAGNOSIS — B373 Candidiasis of vulva and vagina: Secondary | ICD-10-CM | POA: Diagnosis not present

## 2020-08-03 DIAGNOSIS — O99013 Anemia complicating pregnancy, third trimester: Secondary | ICD-10-CM | POA: Diagnosis not present

## 2020-08-03 NOTE — Telephone Encounter (Signed)
Preadmission screen  

## 2020-08-04 ENCOUNTER — Inpatient Hospital Stay (HOSPITAL_COMMUNITY)
Admission: AD | Admit: 2020-08-04 | Discharge: 2020-08-06 | DRG: 806 | Disposition: A | Discharge status: Home / Self Care | Payer: BC Managed Care – PPO | Attending: Obstetrics and Gynecology | Admitting: Obstetrics and Gynecology

## 2020-08-04 ENCOUNTER — Encounter (HOSPITAL_COMMUNITY): Payer: Self-pay | Admitting: Obstetrics and Gynecology

## 2020-08-04 ENCOUNTER — Other Ambulatory Visit: Payer: Self-pay

## 2020-08-04 ENCOUNTER — Inpatient Hospital Stay (HOSPITAL_COMMUNITY): Payer: BC Managed Care – PPO | Admitting: Anesthesiology

## 2020-08-04 DIAGNOSIS — O9081 Anemia of the puerperium: Secondary | ICD-10-CM | POA: Diagnosis not present

## 2020-08-04 DIAGNOSIS — Z3A38 38 weeks gestation of pregnancy: Secondary | ICD-10-CM | POA: Diagnosis not present

## 2020-08-04 DIAGNOSIS — O26899 Other specified pregnancy related conditions, unspecified trimester: Secondary | ICD-10-CM

## 2020-08-04 DIAGNOSIS — Z6791 Unspecified blood type, Rh negative: Secondary | ICD-10-CM

## 2020-08-04 DIAGNOSIS — Z412 Encounter for routine and ritual male circumcision: Secondary | ICD-10-CM | POA: Diagnosis not present

## 2020-08-04 DIAGNOSIS — Z20822 Contact with and (suspected) exposure to covid-19: Secondary | ICD-10-CM | POA: Diagnosis not present

## 2020-08-04 DIAGNOSIS — O26893 Other specified pregnancy related conditions, third trimester: Secondary | ICD-10-CM | POA: Diagnosis not present

## 2020-08-04 DIAGNOSIS — O9902 Anemia complicating childbirth: Secondary | ICD-10-CM | POA: Diagnosis not present

## 2020-08-04 DIAGNOSIS — D649 Anemia, unspecified: Secondary | ICD-10-CM | POA: Diagnosis not present

## 2020-08-04 DIAGNOSIS — D62 Acute posthemorrhagic anemia: Secondary | ICD-10-CM | POA: Diagnosis not present

## 2020-08-04 DIAGNOSIS — Z23 Encounter for immunization: Secondary | ICD-10-CM | POA: Diagnosis not present

## 2020-08-04 LAB — CBC
HCT: 34.7 % — ABNORMAL LOW (ref 36.0–46.0)
Hemoglobin: 11.2 g/dL — ABNORMAL LOW (ref 12.0–15.0)
MCH: 26.1 pg (ref 26.0–34.0)
MCHC: 32.3 g/dL (ref 30.0–36.0)
MCV: 80.9 fL (ref 80.0–100.0)
Platelets: 188 10*3/uL (ref 150–400)
RBC: 4.29 MIL/uL (ref 3.87–5.11)
RDW: 14.3 % (ref 11.5–15.5)
WBC: 12 10*3/uL — ABNORMAL HIGH (ref 4.0–10.5)
nRBC: 0 % (ref 0.0–0.2)

## 2020-08-04 LAB — RESPIRATORY PANEL BY RT PCR (FLU A&B, COVID)
Influenza A by PCR: NEGATIVE
Influenza B by PCR: NEGATIVE
SARS Coronavirus 2 by RT PCR: NEGATIVE

## 2020-08-04 MED ORDER — OXYTOCIN-SODIUM CHLORIDE 30-0.9 UT/500ML-% IV SOLN
2.5000 [IU]/h | INTRAVENOUS | Status: DC
  Filled 2020-08-04: qty 500

## 2020-08-04 MED ORDER — ONDANSETRON HCL 4 MG/2ML IJ SOLN: 4.0000 mg | Freq: Four times a day (QID) | INTRAMUSCULAR | Status: DC | PRN

## 2020-08-04 MED ORDER — PHENYLEPHRINE 40 MCG/ML (10ML) SYRINGE FOR IV PUSH (FOR BLOOD PRESSURE SUPPORT): 80.0000 ug | PREFILLED_SYRINGE | INTRAVENOUS | Status: DC | PRN

## 2020-08-04 MED ORDER — OXYCODONE-ACETAMINOPHEN 5-325 MG PO TABS: 1.0000 | ORAL_TABLET | ORAL | Status: DC | PRN

## 2020-08-04 MED ORDER — ACETAMINOPHEN 325 MG PO TABS: 650.0000 mg | ORAL_TABLET | ORAL | Status: DC | PRN

## 2020-08-04 MED ORDER — EPHEDRINE 5 MG/ML INJ: 10.0000 mg | INTRAVENOUS | Status: DC | PRN

## 2020-08-04 MED ORDER — PHENYLEPHRINE 40 MCG/ML (10ML) SYRINGE FOR IV PUSH (FOR BLOOD PRESSURE SUPPORT)
PREFILLED_SYRINGE | INTRAVENOUS | Status: AC
  Administered 2020-08-04: 200 ug
  Filled 2020-08-04: qty 10

## 2020-08-04 MED ORDER — LIDOCAINE-EPINEPHRINE (PF) 2 %-1:200000 IJ SOLN
INTRAMUSCULAR | Status: DC | PRN
  Administered 2020-08-04: 6 mL via EPIDURAL

## 2020-08-04 MED ORDER — DIPHENHYDRAMINE HCL 50 MG/ML IJ SOLN: 12.5000 mg | INTRAMUSCULAR | Status: DC | PRN

## 2020-08-04 MED ORDER — FENTANYL-BUPIVACAINE-NACL 0.5-0.125-0.9 MG/250ML-% EP SOLN
EPIDURAL | Status: AC
Start: 2020-08-04 — End: 2020-08-05
  Filled 2020-08-04: qty 250

## 2020-08-04 MED ORDER — FLEET ENEMA 7-19 GM/118ML RE ENEM: 1.0000 | ENEMA | Freq: Every day | RECTAL | Status: DC | PRN

## 2020-08-04 MED ORDER — OXYTOCIN BOLUS FROM INFUSION
333.0000 mL | Freq: Once | INTRAVENOUS | Status: AC
  Administered 2020-08-05: 333 mL via INTRAVENOUS

## 2020-08-04 MED ORDER — FENTANYL-BUPIVACAINE-NACL 0.5-0.125-0.9 MG/250ML-% EP SOLN: 12.0000 mL/h | EPIDURAL | Status: DC | PRN

## 2020-08-04 MED ORDER — LACTATED RINGERS IV SOLN: 500.0000 mL | INTRAVENOUS | Status: DC | PRN

## 2020-08-04 MED ORDER — SOD CITRATE-CITRIC ACID 500-334 MG/5ML PO SOLN: 30.0000 mL | ORAL | Status: DC | PRN

## 2020-08-04 MED ORDER — OXYCODONE-ACETAMINOPHEN 5-325 MG PO TABS: 2.0000 | ORAL_TABLET | ORAL | Status: DC | PRN

## 2020-08-04 MED ORDER — PHENYLEPHRINE 40 MCG/ML (10ML) SYRINGE FOR IV PUSH (FOR BLOOD PRESSURE SUPPORT)
80.0000 ug | PREFILLED_SYRINGE | INTRAVENOUS | Status: DC | PRN
  Administered 2020-08-05 (×2): 80 ug via INTRAVENOUS

## 2020-08-04 MED ORDER — LACTATED RINGERS IV SOLN: 500.0000 mL | Freq: Once | INTRAVENOUS | Status: DC

## 2020-08-04 MED ORDER — LIDOCAINE HCL (PF) 1 % IJ SOLN: 30.0000 mL | INTRAMUSCULAR | Status: DC | PRN

## 2020-08-04 MED ORDER — SODIUM CHLORIDE (PF) 0.9 % IJ SOLN
INTRAMUSCULAR | Status: DC | PRN
  Administered 2020-08-04: 12 mL/h via EPIDURAL

## 2020-08-04 MED ORDER — LACTATED RINGERS IV SOLN: INTRAVENOUS | Status: DC

## 2020-08-04 NOTE — Anesthesia Preprocedure Evaluation (Signed)
Anesthesia Evaluation  Patient identified by MRN, date of birth, ID band Patient awake    Reviewed: Allergy & Precautions, NPO status , Patient's Chart, lab work & pertinent test results  Airway Mallampati: II  TM Distance: >3 FB Neck ROM: Full    Dental no notable dental hx.    Pulmonary neg pulmonary ROS,    Pulmonary exam normal breath sounds clear to auscultation       Cardiovascular negative cardio ROS Normal cardiovascular exam Rhythm:Regular Rate:Normal     Neuro/Psych negative neurological ROS  negative psych ROS   GI/Hepatic negative GI ROS, Neg liver ROS,   Endo/Other  negative endocrine ROS  Renal/GU negative Renal ROS  negative genitourinary   Musculoskeletal negative musculoskeletal ROS (+)   Abdominal   Peds  Hematology  (+) Blood dyscrasia, anemia ,   Anesthesia Other Findings   Reproductive/Obstetrics (+) Pregnancy                             Anesthesia Physical Anesthesia Plan  ASA: II  Anesthesia Plan: Epidural   Post-op Pain Management:    Induction:   PONV Risk Score and Plan: Treatment may vary due to age or medical condition  Airway Management Planned: Natural Airway  Additional Equipment:   Intra-op Plan:   Post-operative Plan:   Informed Consent: I have reviewed the patients History and Physical, chart, labs and discussed the procedure including the risks, benefits and alternatives for the proposed anesthesia with the patient or authorized representative who has indicated his/her understanding and acceptance.       Plan Discussed with: Anesthesiologist  Anesthesia Plan Comments: (Patient identified. Risks, benefits, options discussed with patient including but not limited to bleeding, infection, nerve damage, paralysis, failed block, incomplete pain control, headache, blood pressure changes, nausea, vomiting, reactions to medication, itching, and  post partum back pain. Confirmed with bedside nurse the patient's most recent platelet count. Confirmed with the patient that they are not taking any anticoagulation, have any bleeding history or any family history of bleeding disorders. Patient expressed understanding and wishes to proceed. All questions were answered. )        Anesthesia Quick Evaluation  

## 2020-08-04 NOTE — Anesthesia Procedure Notes (Signed)
Epidural  Start time: 08/04/2020 11:01 PM End time: 08/04/2020 11:11 PM  Needle:  Needle insertion depth: 5.5 cm Catheter at skin depth: 11 cm

## 2020-08-04 NOTE — MAU Note (Signed)
Contractions 3-5 mins apart.  Some LOF since 1730-clear fluid.  Some episodes of VB.  Anemia is only complication w/ her pregnancy.  2.5 cm on last exam.

## 2020-08-04 NOTE — H&P (Addendum)
OB ADMISSION/ HISTORY & PHYSICAL:  Admission Date: 08/04/2020  9:33 PM  Admit Diagnosis: Normal labor [O80, Z37.9]    Megan Lowe is a 29 y.o. female presenting for contractions and leaking of fluid. Pt states she started leaking clear fluid at 1700 and contractions started at 1930. States she had a small amount of bloody show. Endorses + fetal movement. Husband at the bedside providing support. Expecting baby boy "[place]on Branch".   Prenatal History: [id number]   EDC : 08/18/2020, by Other Basis  Prenatal care at [facility] since 11 weeks  Prenatal course complicated by: 1. Anemia, last hemoglobin 9.5, on PO iron 2. RH negative, Rhogam given at 28 weeks  Prenatal Labs: ABO, Rh: O (04/14 0000)  Antibody: Negative (04/14 0000) Rubella: Immune (04/14 0000)  RPR: Nonreactive (04/14 0000)  HBsAg: Negative (04/14 0000)  HIV: Non-reactive (04/14 0000)  GBS: Negative/-- (10/01 0000)  1 hr Glucola : 80 Genetic Screening: AFP/quad screen negative Ultrasound: normal XY anatomy, posterior placenta, growth U/S at 35 weeks, AFI 20cm, 7lbs 0oz, 90%  TDAP: UTD FLU: Offered at discharge COVID-19: Declined    Maternal Diabetes: No Genetic Screening: Normal Maternal Ultrasounds/Referrals: Normal Fetal Ultrasounds or other Referrals:  None Maternal Substance Abuse:  No Significant Maternal Medications:  None Significant Maternal Lab Results:  Group B Strep negative and Rh negative Other Comments:  None  Medical / Surgical History :  Past medical history:  Past Medical History:  Diagnosis Date  . Contraceptive management 07/28/2013  . Dyspareunia 11/04/2014  . Family history of adverse reaction to anesthesia    nausea and vomitting  . Medical history non-contributory   . Vaginal discharge 11/04/2014  . Yeast infection 11/04/2014    Past surgical history:  Past Surgical History:  Procedure Laterality Date  . NO PAST SURGERIES      Family History:  Family History  Problem Relation  Age of Onset  . Hypertension Mother   . Cancer Paternal Grandfather        skin   . Anxiety disorder Sister   . Depression Sister   . Pulmonary embolism Sister   . Hypertension Maternal Grandmother     Social History:  reports that she has never smoked. She has never used smokeless tobacco. She reports that she does not drink alcohol and does not use drugs.  Allergies: Patient has no known allergies.   Current Medications at time of admission:  Medications Prior to Admission  Medication Sig Dispense Refill Last Dose  . IRON PO Take 1 tablet by mouth daily.    08/04/2020 at Unknown time  . Prenatal Vit-Fe Fumarate-FA (PRENATAL MULTIVITAMIN) TABS tablet Take 1 tablet by mouth daily at 12 noon.   08/04/2020 at Unknown time  . acetaminophen (TYLENOL) 325 MG tablet Take 2 tablets (650 mg total) by mouth every 4 (four) hours as needed (for pain scale < 4). 60 tablet 0  at not taking  . fluticasone (FLONASE) 50 MCG/ACT nasal spray Place 1 spray into both nostrils daily for 14 days. 16 g 0   . guaiFENesin (ROBITUSSIN) 100 MG/5ML liquid Take 5-10 mLs (100-200 mg total) by mouth every 4 (four) hours as needed for cough. 118 mL 0  at not taking  . ibuprofen (ADVIL,MOTRIN) 600 MG tablet Take 1 tablet (600 mg total) by mouth every 6 (six) hours. 30 tablet 0  at no taking    Review of Systems: Review of Systems  All other systems reviewed and are negative.  Physical Exam:  Vital signs and nursing notes reviewed.  Patient Vitals for the past 24 hrs:  BP Temp Pulse Resp Weight  08/04/20 2153 120/86 -- 78 -- --  08/04/20 2152 -- 98.3 F (36.8 C) -- 19 93.9 kg    General: AAO x 3, NAD Heart: RRR Lungs:CTAB Abdomen: Gravid, NT, Leopold's 8-8.5lbs Extremities: no edema Genitalia / VE: Dilation: 7 Effacement (%): 70 Cervical Position: Middle Station: -3, -2 Presentation: Vertex Exam by:: TLYTLE RN   FHR: 135BPM, mod variability, + accels, no decels TOCO: Contractions q 2-4  minutes  Labs:   Pending T&S, CBC, RPR  No results for input(s): WBC, HGB, HCT, PLT in the last 72 hours.  Assessment:  29 y.o. G4P1021 at [redacted]w[redacted]d, SROM at 1700 with clear fluid  1. Active stage of labor 2. FHR category 1 3. GBS negative 4. Desires epidural 5. Plans to breastfeed 6. Anemia 9.5 at 37 weeks, on PO iron  Plan:  1. Admit to BS 2. Routine L&D orders 3. Analgesia/anesthesia PRN  4. Expectant management 5. Consider IV Fereheme postpartum for anemia 6. Anticipate NSVB  Dr. Billy Coast notified of admission / plan of care Discussed with CNM and agree with plan  Clancy Gourd, MSN 08/04/2020, 10:27 PM

## 2020-08-05 ENCOUNTER — Other Ambulatory Visit: Payer: Self-pay | Admitting: Obstetrics

## 2020-08-05 ENCOUNTER — Encounter (HOSPITAL_COMMUNITY): Payer: Self-pay | Admitting: Obstetrics and Gynecology

## 2020-08-05 DIAGNOSIS — O99013 Anemia complicating pregnancy, third trimester: Secondary | ICD-10-CM

## 2020-08-05 LAB — CBC
HCT: 32.4 % — ABNORMAL LOW (ref 36.0–46.0)
HCT: 29.8 % — ABNORMAL LOW (ref 36.0–46.0)
HCT: 27.3 % — ABNORMAL LOW (ref 36.0–46.0)
Hemoglobin: 10.4 g/dL — ABNORMAL LOW (ref 12.0–15.0)
Hemoglobin: 9.6 g/dL — ABNORMAL LOW (ref 12.0–15.0)
Hemoglobin: 8.9 g/dL — ABNORMAL LOW (ref 12.0–15.0)
MCH: 26.1 pg (ref 26.0–34.0)
MCH: 26.2 pg (ref 26.0–34.0)
MCH: 26.6 pg (ref 26.0–34.0)
MCHC: 32.1 g/dL (ref 30.0–36.0)
MCHC: 32.2 g/dL (ref 30.0–36.0)
MCHC: 32.6 g/dL (ref 30.0–36.0)
MCV: 81.4 fL (ref 80.0–100.0)
MCV: 81.2 fL (ref 80.0–100.0)
MCV: 81.7 fL (ref 80.0–100.0)
Platelets: 156 10*3/uL (ref 150–400)
Platelets: 165 10*3/uL (ref 150–400)
Platelets: 170 10*3/uL (ref 150–400)
RBC: 3.98 MIL/uL (ref 3.87–5.11)
RBC: 3.67 MIL/uL — ABNORMAL LOW (ref 3.87–5.11)
RBC: 3.34 MIL/uL — ABNORMAL LOW (ref 3.87–5.11)
RDW: 14.2 % (ref 11.5–15.5)
RDW: 14.1 % (ref 11.5–15.5)
RDW: 14.3 % (ref 11.5–15.5)
WBC: 16.1 10*3/uL — ABNORMAL HIGH (ref 4.0–10.5)
WBC: 13.1 10*3/uL — ABNORMAL HIGH (ref 4.0–10.5)
WBC: 10.5 10*3/uL (ref 4.0–10.5)
nRBC: 0 % (ref 0.0–0.2)
nRBC: 0 % (ref 0.0–0.2)
nRBC: 0 % (ref 0.0–0.2)

## 2020-08-05 LAB — TYPE AND SCREEN
ABO/RH(D): O NEG
Antibody Screen: POSITIVE

## 2020-08-05 LAB — RPR: RPR Ser Ql: NONREACTIVE

## 2020-08-05 MED ORDER — WITCH HAZEL-GLYCERIN EX PADS: 1.0000 "application " | MEDICATED_PAD | CUTANEOUS | Status: DC | PRN

## 2020-08-05 MED ORDER — SODIUM CHLORIDE 0.9 % IV SOLN
3.0000 g | Freq: Once | INTRAVENOUS | Status: AC
  Administered 2020-08-05: 3 g via INTRAVENOUS
  Filled 2020-08-05: qty 3

## 2020-08-05 MED ORDER — METHYLERGONOVINE MALEATE 0.2 MG PO TABS
0.2000 mg | ORAL_TABLET | ORAL | Status: DC
  Administered 2020-08-05 – 2020-08-06 (×5): 0.2 mg via ORAL
  Filled 2020-08-05 (×5): qty 1

## 2020-08-05 MED ORDER — LACTATED RINGERS IV BOLUS
1000.0000 mL | Freq: Once | INTRAVENOUS | Status: AC
  Administered 2020-08-05: 1000 mL via INTRAVENOUS

## 2020-08-05 MED ORDER — TETANUS-DIPHTH-ACELL PERTUSSIS 5-2.5-18.5 LF-MCG/0.5 IM SUSP: 0.5000 mL | Freq: Once | INTRAMUSCULAR | Status: DC

## 2020-08-05 MED ORDER — ONDANSETRON HCL 4 MG/2ML IJ SOLN: 4.0000 mg | INTRAMUSCULAR | Status: DC | PRN

## 2020-08-05 MED ORDER — METHYLERGONOVINE MALEATE 0.2 MG/ML IJ SOLN
0.2000 mg | INTRAMUSCULAR | Status: DC | PRN
  Administered 2020-08-05: 0.2 mg via INTRAMUSCULAR
  Filled 2020-08-05: qty 1

## 2020-08-05 MED ORDER — IBUPROFEN 600 MG PO TABS
600.0000 mg | ORAL_TABLET | Freq: Four times a day (QID) | ORAL | Status: DC
  Administered 2020-08-05 – 2020-08-06 (×6): 600 mg via ORAL
  Filled 2020-08-05 (×6): qty 1

## 2020-08-05 MED ORDER — BENZOCAINE-MENTHOL 20-0.5 % EX AERO
1.0000 "application " | INHALATION_SPRAY | CUTANEOUS | Status: DC | PRN
  Administered 2020-08-05: 1 via TOPICAL
  Filled 2020-08-05: qty 56

## 2020-08-05 MED ORDER — COCONUT OIL OIL: 1.0000 "application " | TOPICAL_OIL | Status: DC | PRN

## 2020-08-05 MED ORDER — DIPHENHYDRAMINE HCL 25 MG PO CAPS: 25.0000 mg | ORAL_CAPSULE | Freq: Four times a day (QID) | ORAL | Status: DC | PRN

## 2020-08-05 MED ORDER — ZOLPIDEM TARTRATE 5 MG PO TABS: 5.0000 mg | ORAL_TABLET | Freq: Every evening | ORAL | Status: DC | PRN

## 2020-08-05 MED ORDER — SIMETHICONE 80 MG PO CHEW: 80.0000 mg | CHEWABLE_TABLET | ORAL | Status: DC | PRN

## 2020-08-05 MED ORDER — TRANEXAMIC ACID-NACL 1000-0.7 MG/100ML-% IV SOLN
INTRAVENOUS | Status: AC
  Filled 2020-08-05: qty 100

## 2020-08-05 MED ORDER — RHO D IMMUNE GLOBULIN 1500 UNIT/2ML IJ SOSY
300.0000 ug | PREFILLED_SYRINGE | Freq: Once | INTRAMUSCULAR | Status: AC
  Administered 2020-08-05: 300 ug via INTRAVENOUS
  Filled 2020-08-05: qty 2

## 2020-08-05 MED ORDER — PRENATAL MULTIVITAMIN CH
1.0000 | ORAL_TABLET | Freq: Every day | ORAL | Status: DC
  Administered 2020-08-05 – 2020-08-06 (×2): 1 via ORAL
  Filled 2020-08-05 (×2): qty 1

## 2020-08-05 MED ORDER — ONDANSETRON HCL 4 MG PO TABS: 4.0000 mg | ORAL_TABLET | ORAL | Status: DC | PRN

## 2020-08-05 MED ORDER — SENNOSIDES-DOCUSATE SODIUM 8.6-50 MG PO TABS
2.0000 | ORAL_TABLET | ORAL | Status: DC
  Administered 2020-08-05: 2 via ORAL
  Filled 2020-08-05: qty 2

## 2020-08-05 MED ORDER — METHYLERGONOVINE MALEATE 0.2 MG/ML IJ SOLN
INTRAMUSCULAR | Status: AC
  Administered 2020-08-05: 0.2 mg
  Filled 2020-08-05: qty 1

## 2020-08-05 MED ORDER — DIBUCAINE (PERIANAL) 1 % EX OINT: 1.0000 "application " | TOPICAL_OINTMENT | CUTANEOUS | Status: DC | PRN

## 2020-08-05 MED ORDER — METHYLERGONOVINE MALEATE 0.2 MG PO TABS: 0.2000 mg | ORAL_TABLET | ORAL | Status: DC | PRN

## 2020-08-05 MED ORDER — ACETAMINOPHEN 325 MG PO TABS: 650.0000 mg | ORAL_TABLET | ORAL | Status: DC | PRN

## 2020-08-05 MED ORDER — TRANEXAMIC ACID-NACL 1000-0.7 MG/100ML-% IV SOLN
1000.0000 mg | Freq: Once | INTRAVENOUS | Status: AC
  Administered 2020-08-05: 1000 mg via INTRAVENOUS

## 2020-08-05 NOTE — Progress Notes (Signed)
Went in to reassess patient at 4 hour check and noted uterus to be U/E and boggy initially but firm with massage, small continuous trickle noted when massaging uterus that subsided after a minute of fundal massage.  Pt stated she had emptied her bladder 30 minutes prior.  Pulse was starting to slowly increase and patient was hypotensive with a BP =  85/53, pt declined lightheadedness or dizziness.  Informed Dorisann Frames CNM of current status and order LR bolus, IM methergine followed by methergine series, and stat CBC.  Will continue to monitor and assess for any changes.

## 2020-08-05 NOTE — Anesthesia Postprocedure Evaluation (Signed)
Anesthesia Post Note  Patient: Megan Lowe  Procedure(s) Performed: AN AD HOC LABOR EPIDURAL     Patient location during evaluation: Mother Baby Anesthesia Type: Epidural Level of consciousness: awake and alert and oriented Pain management: satisfactory to patient Vital Signs Assessment: post-procedure vital signs reviewed and stable Respiratory status: respiratory function stable Cardiovascular status: stable Postop Assessment: no headache, no backache, epidural receding, patient able to bend at knees, no signs of nausea or vomiting, adequate PO intake and able to ambulate Anesthetic complications: no   No complications documented.  Last Vitals:  Vitals:   08/05/20 0445 08/05/20 0919  BP: 123/74 94/61  Pulse: 63 73  Resp: 18 17  Temp: 37 C 37.1 C  SpO2: 100%     Last Pain:  Vitals:   08/05/20 0919  TempSrc: Oral  PainSc: 1    Pain Goal:                   Jerrie Gullo

## 2020-08-06 LAB — RH IG WORKUP (INCLUDES ABO/RH)
ABO/RH(D): O NEG
Fetal Screen: NEGATIVE
Gestational Age(Wks): 38
Unit division: 0

## 2020-08-06 MED ORDER — IBUPROFEN 600 MG PO TABS: 600.0000 mg | ORAL_TABLET | Freq: Four times a day (QID) | ORAL | 0 refills | Status: DC

## 2020-08-06 MED ORDER — BENZOCAINE-MENTHOL 20-0.5 % EX AERO: 1.0000 "application " | INHALATION_SPRAY | CUTANEOUS | Status: DC | PRN

## 2020-08-06 MED ORDER — SODIUM CHLORIDE 0.9 % IV SOLN: INTRAVENOUS | Status: DC

## 2020-08-06 MED ORDER — SODIUM CHLORIDE 0.9 % IV SOLN
510.0000 mg | Freq: Once | INTRAVENOUS | Status: AC
  Administered 2020-08-06: 510 mg via INTRAVENOUS
  Filled 2020-08-06: qty 17

## 2020-08-06 NOTE — Discharge Summary (Signed)
Postpartum Discharge Summary  Date of Service updated 08/06/2020     Patient Name: Megan Lowe DOB: 09/26/1991 MRN: [id number]  Date of admission: 08/04/2020 Delivery date:08/05/2020  Delivering provider: Gavin Potters K  Date of discharge: 08/06/2020  Admitting diagnosis: Normal labor [O80, Z37.9] Intrauterine pregnancy: [redacted]w[redacted]d    Secondary diagnosis:  Principal Problem:   Postpartum care following vaginal delivery 10/21 Active Problems:   Rh negative, maternal   SVD (spontaneous vaginal delivery) 10/21   Second degree perineal laceration  Additional problems: ABL Anemia compounding chronic IDA    Discharge diagnosis: Term Pregnancy Delivered and Anemia                                              Post partum procedures:rhogam and IV Feraheme x 1 dose  Augmentation: AROM Complications: None  Hospital course: Onset of Labor With Vaginal Delivery      29y.o. yo [id number] admitted in Latent Labor on 08/04/2020. Patient had an uncomplicated labor course as follows:  Membrane Rupture Time/Date: 12:05 AM ,08/05/2020   Delivery Method:Vaginal, Spontaneous  Episiotomy: None  Lacerations:  2nd degree  Patient had an uncomplicated postpartum course.  She is ambulating, tolerating a regular diet, passing flatus, and urinating well. Patient is discharged home in stable condition on 08/06/20.  Newborn Data: Birth date:08/05/2020  Birth time:1:26 AM  Gender:Female  Living status:Living  Apgars:8 ,9  [id number]   Magnesium Sulfate received: No BMZ received: No Rhophylac:Yes MMR:N/A T-DaP:Given prenatally Flu: N/A  COVID: declined  Transfusion: no   Physical exam  Vitals:   08/05/20 1420 08/05/20 1700 08/05/20 2045 08/06/20 0625  BP: (!) 98/55 118/74 100/70 103/68  Pulse: 76 82 69 71  Resp:   16 18  Temp:   98.2 F (36.8 C) 98.2 F (36.8 C)  TempSrc:   Oral Oral  SpO2:    95%  Weight:       Alert and oriented X3             Lungs: Clear and  unlabored             Heart: regular rate and rhythm / no murmurs             Abdomen: soft, non-tender, non-distended              Fundus: firm, non-tender, U-E             Perineum: repair intact, no edema or ecchymosis              Lochia: scant rubra on pad              Extremities: no edema, no calf pain or tenderness Labs: Lab Results  Component Value Date   WBC 10.5 08/05/2020   HGB 8.9 (L) 08/05/2020   HCT 27.3 (L) 08/05/2020   MCV 81.7 08/05/2020   PLT 170 08/05/2020   CMP Latest Ref Rng & Units 05/10/2017  Glucose 65 - 99 mg/dL -  BUN 6 - 20 mg/dL 12  Creatinine 0.44 - 1.00 mg/dL 0.63  Sodium 135 - 145 mmol/L -  Potassium 3.5 - 5.1 mmol/L -  Chloride 101 - 111 mmol/L -  CO2 22 - 32 mmol/L -  Calcium 8.9 - 10.3 mg/dL -  Total Protein 6.5 - 8.1 g/dL -  Total  Bilirubin 0.3 - 1.2 mg/dL -  Alkaline Phos 38 - 126 U/L -  AST 15 - 41 U/L 18  ALT 14 - 54 U/L -   Edinburgh Score: Edinburgh Postnatal Depression Scale Screening Tool 08/05/2020  I have been able to laugh and see the funny side of things. 0  I have looked forward with enjoyment to things. 0  I have blamed myself unnecessarily when things went wrong. 1  I have been anxious or worried for no good reason. 0  I have felt scared or panicky for no good reason. 0  Things have been getting on top of me. 0  I have been so unhappy that I have had difficulty sleeping. 0  I have felt sad or miserable. 0  I have been so unhappy that I have been crying. 0  The thought of harming myself has occurred to me. 0  Edinburgh Postnatal Depression Scale Total 1      After visit meds:  Allergies as of 08/06/2020   No Known Allergies     Medication List    STOP taking these medications   fluticasone 50 MCG/ACT nasal spray Commonly known as: FLONASE   guaiFENesin 100 MG/5ML liquid Commonly known as: ROBITUSSIN     TAKE these medications   acetaminophen 325 MG tablet Commonly known as: Tylenol Take 2 tablets (650 mg  total) by mouth every 4 (four) hours as needed (for pain scale < 4).   benzocaine-Menthol 20-0.5 % Aero Commonly known as: DERMOPLAST Apply 1 application topically as needed for irritation (perineal discomfort).   ibuprofen 600 MG tablet Commonly known as: ADVIL Take 1 tablet (600 mg total) by mouth every 6 (six) hours.   IRON PO Take 1 tablet by mouth daily.   prenatal multivitamin Tabs tablet Take 1 tablet by mouth daily at 12 noon.            Discharge Care Instructions  (From admission, onward)         Start     Ordered   08/06/20 0000  Discharge wound care:       Comments: Warm water sitz baths PRN   08/06/20 1042           Discharge home in stable condition Infant Feeding: Bottle Infant Disposition:home with mother Discharge instruction: per After Visit Summary and Postpartum booklet. Activity: Advance as tolerated. Pelvic rest for 6 weeks.  Diet: low salt diet Anticipated Birth Control: Unsure Postpartum Appointment:6 weeks Additional Postpartum F/U: Postpartum Depression checkup Future Appointments:No future appointments. Follow up Visit:  Follow-up Information    Aloha Gell, MD. Schedule an appointment as soon as possible for a visit in 6 week(s).   Specialty: Obstetrics and Gynecology Why: Postpartum visit  Contact information: [place] [address] [phone]                   08/06/2020 Darliss Cheney, CNM

## 2020-08-06 NOTE — Progress Notes (Addendum)
PPD #1, SVD, 2nd degree perineal, baby boy "[place]"  S:  Reports feeling well. Bleeding has improved overnight. Desires early d/c home today.              Tolerating po/ No nausea or vomiting / Denies dizziness or SOB             Bleeding is light             Pain controlled with Motrin             Up ad lib / ambulatory / voiding QS  Newborn formula feeding  / Circumcision - planning prior to d/c  O:               VS: BP 103/68 (BP Location: Right Arm)   Pulse 71   Temp 98.2 F (36.8 C) (Oral)   Resp 18   Wt 93.9 kg   SpO2 95%   Breastfeeding Unknown   BMI 36.68 kg/m   Patient Vitals for the past 24 hrs:  BP Temp Temp src Pulse Resp SpO2  08/06/20 0625 103/68 98.2 F (36.8 C) Oral 71 18 95 %  08/05/20 2045 100/70 98.2 F (36.8 C) Oral 69 16 --  08/05/20 1700 118/74 -- -- 82 -- --  08/05/20 1420 (!) 98/55 -- -- 76 -- --  08/05/20 1225 (!) 98/54 -- -- 79 -- --  08/05/20 1220 (!) 85/53 98.7 F (37.1 C) Oral 89 17 --     LABS:             Recent Labs    08/05/20 1254 08/05/20 2034  WBC 13.1* 10.5  HGB 9.6* 8.9*  PLT 165 170               Blood type: --/--/O NEG (10/21 0520)  Rubella: Immune (04/14 0000)                     I&O: Intake/Output      10/21 0701 - 10/22 0700 10/22 0701 - 10/23 0700   I.V. (mL/kg) 0 (0)    Other 0    IV Piggyback     Total Intake(mL/kg) 0 (0)    Urine (mL/kg/hr)     Blood     Total Output     Net 0                       Physical Exam:             Alert and oriented X3  Lungs: Clear and unlabored  Heart: regular rate and rhythm / no murmurs  Abdomen: soft, non-tender, non-distended              Fundus: firm, non-tender, U-E  Perineum: repair intact, no edema or ecchymosis   Lochia: scant rubra on pad   Extremities: no edema, no calf pain or tenderness    A/P: PPD # 1, SVD  2nd degree perineal repair  ABL Anemia compounding chronic IDA r/t uterine atony   - s/p manual exploration and received 6 doses of Methergine, TXA,  and Unasyn x 1 dose    - Symptomatic yesterday with mild hypotension, improved today   - Discussed r/b of IV iron and patient agrees   - Feraheme x 1 dose, then plan oral FE daily x 6 weeks    RH Negative, Baby RH positive   - s/p Rhogam 10/21  Breast care for formula feeding mothers reviewed  Doing well - stable status  Routine post partum orders  Discharge home after IV iron and circ   WOB discharge book given, instructions and warning s/s reviewed  F/u with Dr. Ernestina Penna in 6 weeks or PRN  Carlean Jews, MSN, CNM [facility]

## 2020-08-09 ENCOUNTER — Other Ambulatory Visit (HOSPITAL_COMMUNITY): Payer: BC Managed Care – PPO

## 2020-08-11 ENCOUNTER — Inpatient Hospital Stay (HOSPITAL_COMMUNITY): Admission: AD | Admit: 2020-08-11 | Payer: BC Managed Care – PPO | Source: Home / Self Care | Admitting: Obstetrics

## 2020-08-11 ENCOUNTER — Inpatient Hospital Stay (HOSPITAL_COMMUNITY): Payer: BC Managed Care – PPO

## 2021-03-17 DIAGNOSIS — D649 Anemia, unspecified: Secondary | ICD-10-CM | POA: Diagnosis not present

## 2021-03-17 DIAGNOSIS — Z01419 Encounter for gynecological examination (general) (routine) without abnormal findings: Secondary | ICD-10-CM | POA: Diagnosis not present

## 2021-03-17 DIAGNOSIS — Z1329 Encounter for screening for other suspected endocrine disorder: Secondary | ICD-10-CM | POA: Diagnosis not present

## 2021-03-17 DIAGNOSIS — Z6834 Body mass index (BMI) 34.0-34.9, adult: Secondary | ICD-10-CM | POA: Diagnosis not present

## 2021-03-17 DIAGNOSIS — Z13220 Encounter for screening for lipoid disorders: Secondary | ICD-10-CM | POA: Diagnosis not present

## 2021-03-17 DIAGNOSIS — Z131 Encounter for screening for diabetes mellitus: Secondary | ICD-10-CM | POA: Diagnosis not present

## 2021-04-05 DIAGNOSIS — N924 Excessive bleeding in the premenopausal period: Secondary | ICD-10-CM | POA: Diagnosis not present

## 2021-04-05 DIAGNOSIS — N946 Dysmenorrhea, unspecified: Secondary | ICD-10-CM | POA: Diagnosis not present

## 2021-05-27 DIAGNOSIS — E6609 Other obesity due to excess calories: Secondary | ICD-10-CM | POA: Diagnosis not present

## 2021-05-27 DIAGNOSIS — Z6834 Body mass index (BMI) 34.0-34.9, adult: Secondary | ICD-10-CM | POA: Diagnosis not present

## 2021-05-27 DIAGNOSIS — R635 Abnormal weight gain: Secondary | ICD-10-CM | POA: Diagnosis not present

## 2021-07-20 DIAGNOSIS — M545 Low back pain, unspecified: Secondary | ICD-10-CM | POA: Diagnosis not present

## 2021-08-25 DIAGNOSIS — R6889 Other general symptoms and signs: Secondary | ICD-10-CM | POA: Diagnosis not present

## 2021-09-26 DIAGNOSIS — Z111 Encounter for screening for respiratory tuberculosis: Secondary | ICD-10-CM | POA: Diagnosis not present

## 2021-10-13 DIAGNOSIS — E6609 Other obesity due to excess calories: Secondary | ICD-10-CM | POA: Diagnosis not present

## 2021-10-13 DIAGNOSIS — F4322 Adjustment disorder with anxiety: Secondary | ICD-10-CM | POA: Diagnosis not present

## 2021-10-31 DIAGNOSIS — E669 Obesity, unspecified: Secondary | ICD-10-CM | POA: Diagnosis not present

## 2021-10-31 DIAGNOSIS — Z6832 Body mass index (BMI) 32.0-32.9, adult: Secondary | ICD-10-CM | POA: Diagnosis not present

## 2021-10-31 DIAGNOSIS — J029 Acute pharyngitis, unspecified: Secondary | ICD-10-CM | POA: Diagnosis not present

## 2021-10-31 DIAGNOSIS — R591 Generalized enlarged lymph nodes: Secondary | ICD-10-CM | POA: Diagnosis not present

## 2022-01-31 DIAGNOSIS — N946 Dysmenorrhea, unspecified: Secondary | ICD-10-CM | POA: Diagnosis not present

## 2022-01-31 DIAGNOSIS — F419 Anxiety disorder, unspecified: Secondary | ICD-10-CM | POA: Diagnosis not present

## 2022-01-31 DIAGNOSIS — N924 Excessive bleeding in the premenopausal period: Secondary | ICD-10-CM | POA: Diagnosis not present

## 2022-01-31 DIAGNOSIS — Z309 Encounter for contraceptive management, unspecified: Secondary | ICD-10-CM | POA: Diagnosis not present

## 2022-02-22 DIAGNOSIS — N920 Excessive and frequent menstruation with regular cycle: Secondary | ICD-10-CM | POA: Diagnosis not present

## 2022-02-22 DIAGNOSIS — N921 Excessive and frequent menstruation with irregular cycle: Secondary | ICD-10-CM | POA: Diagnosis not present

## 2022-03-08 DIAGNOSIS — R5383 Other fatigue: Secondary | ICD-10-CM | POA: Diagnosis not present

## 2022-03-08 DIAGNOSIS — N898 Other specified noninflammatory disorders of vagina: Secondary | ICD-10-CM | POA: Diagnosis not present

## 2022-06-12 DIAGNOSIS — Z01419 Encounter for gynecological examination (general) (routine) without abnormal findings: Secondary | ICD-10-CM | POA: Diagnosis not present

## 2022-06-12 DIAGNOSIS — Z6831 Body mass index (BMI) 31.0-31.9, adult: Secondary | ICD-10-CM | POA: Diagnosis not present

## 2022-07-28 DIAGNOSIS — E119 Type 2 diabetes mellitus without complications: Secondary | ICD-10-CM | POA: Diagnosis not present

## 2022-07-28 DIAGNOSIS — Z1331 Encounter for screening for depression: Secondary | ICD-10-CM | POA: Diagnosis not present

## 2022-07-28 DIAGNOSIS — E663 Overweight: Secondary | ICD-10-CM | POA: Diagnosis not present

## 2022-07-28 DIAGNOSIS — Z6829 Body mass index (BMI) 29.0-29.9, adult: Secondary | ICD-10-CM | POA: Diagnosis not present

## 2022-07-28 DIAGNOSIS — Z0001 Encounter for general adult medical examination with abnormal findings: Secondary | ICD-10-CM | POA: Diagnosis not present

## 2022-08-03 ENCOUNTER — Other Ambulatory Visit: Payer: Self-pay | Admitting: Family Medicine

## 2022-08-03 ENCOUNTER — Ambulatory Visit (HOSPITAL_COMMUNITY)
Admission: RE | Admit: 2022-08-03 | Discharge: 2022-08-03 | Disposition: A | Discharge status: Home / Self Care | Payer: BC Managed Care – PPO | Source: Ambulatory Visit | Attending: Family Medicine | Admitting: Family Medicine

## 2022-08-03 ENCOUNTER — Other Ambulatory Visit (HOSPITAL_COMMUNITY): Payer: Self-pay | Admitting: Family Medicine

## 2022-08-03 DIAGNOSIS — N8000 Endometriosis of the uterus, unspecified: Secondary | ICD-10-CM

## 2022-08-03 DIAGNOSIS — R102 Pelvic and perineal pain: Secondary | ICD-10-CM | POA: Diagnosis not present

## 2022-08-03 DIAGNOSIS — E663 Overweight: Secondary | ICD-10-CM | POA: Diagnosis not present

## 2022-08-03 DIAGNOSIS — R1030 Lower abdominal pain, unspecified: Secondary | ICD-10-CM | POA: Diagnosis not present

## 2022-08-03 DIAGNOSIS — Z8742 Personal history of other diseases of the female genital tract: Secondary | ICD-10-CM | POA: Diagnosis not present

## 2022-08-03 DIAGNOSIS — Z6829 Body mass index (BMI) 29.0-29.9, adult: Secondary | ICD-10-CM | POA: Diagnosis not present

## 2022-08-23 DIAGNOSIS — Z23 Encounter for immunization: Secondary | ICD-10-CM | POA: Diagnosis not present

## 2022-12-18 ENCOUNTER — Ambulatory Visit
Admission: EM | Admit: 2022-12-18 | Discharge: 2022-12-18 | Disposition: A | Discharge status: Home / Self Care | Payer: 59 | Attending: Family Medicine | Admitting: Family Medicine

## 2022-12-18 DIAGNOSIS — R1084 Generalized abdominal pain: Secondary | ICD-10-CM | POA: Diagnosis present

## 2022-12-18 DIAGNOSIS — R112 Nausea with vomiting, unspecified: Secondary | ICD-10-CM | POA: Insufficient documentation

## 2022-12-18 LAB — POCT URINALYSIS DIP (MANUAL ENTRY)
Bilirubin, UA: NEGATIVE
Blood, UA: NEGATIVE
Glucose, UA: NEGATIVE mg/dL
Ketones, POC UA: NEGATIVE mg/dL
Nitrite, UA: NEGATIVE
Protein Ur, POC: NEGATIVE mg/dL
Spec Grav, UA: 1.025 (ref 1.010–1.025)
Urobilinogen, UA: 0.2 E.U./dL
pH, UA: 6 (ref 5.0–8.0)

## 2022-12-18 MED ORDER — ALUM & MAG HYDROXIDE-SIMETH 200-200-20 MG/5ML PO SUSP: 30.0000 mL | Freq: Once | ORAL | Status: DC

## 2022-12-18 MED ORDER — ONDANSETRON 4 MG PO TBDP: 4.0000 mg | ORAL_TABLET | Freq: Three times a day (TID) | ORAL | 0 refills | Status: DC | PRN

## 2022-12-18 MED ORDER — LIDOCAINE VISCOUS HCL 2 % MT SOLN: 15.0000 mL | Freq: Once | OROMUCOSAL | Status: DC

## 2022-12-18 MED ORDER — SUCRALFATE 1 G PO TABS: 1.0000 g | ORAL_TABLET | Freq: Three times a day (TID) | ORAL | 0 refills | Status: DC | PRN

## 2022-12-18 NOTE — ED Provider Notes (Signed)
[facility]    CSN: [id number] Arrival date & time: 12/18/22  1125      History   Chief Complaint No chief complaint on file.   HPI Megan Lowe is a 32 y.o. female.   Patient presenting today with 1 day history of initially sharp diffuse upper abdominal pain that is now also in the lower abdomen radiating to her sides bilaterally.  States the pain is constant and 6 out of 10, was sharp last night but now dull and achy.  She had an episode of vomiting last night but none this morning.  Denies any associated fever, chills, urinary symptoms, diarrhea, constipation, upper respiratory symptoms, new foods or medications, recent travel, known sick contacts with similar symptoms.  She has so far tried Pepto-Bismol with no benefit.  No known history of chronic GI issues.  No concern for pregnancy, status post uterine ablation.    Past Medical History:  Diagnosis Date   Contraceptive management 07/28/2013   Dyspareunia 11/04/2014   Family history of adverse reaction to anesthesia    nausea and vomitting   Medical history non-contributory    Vaginal discharge 11/04/2014   Yeast infection 11/04/2014    Patient Active Problem List   Diagnosis Date Noted   SVD (spontaneous vaginal delivery) 10/21 08/05/2020   Second degree perineal laceration 08/05/2020   Postpartum care following vaginal delivery 10/21 08/05/2020   Rh negative, maternal 05/01/2017    Past Surgical History:  Procedure Laterality Date   NO PAST SURGERIES      OB History     Gravida  4   Para  2   Term  2   Preterm  0   AB  2   Living  2      SAB  0   IAB  1   Ectopic  1   Multiple  0   Live Births  2            Home Medications    Prior to Admission medications   Medication Sig Start Date End Date Taking? Authorizing Provider  ondansetron (ZOFRAN-ODT) 4 MG disintegrating tablet Take 1 tablet (4 mg total) by mouth every 8 (eight) hours as needed for nausea or vomiting.  12/18/22  Yes Volney American, PA-C  sucralfate (CARAFATE) 1 g tablet Take 1 tablet (1 g total) by mouth 3 (three) times daily as needed. May dissolve 1 tablet into a glass of water and drink 3 times daily as needed to help soothe stomach 12/18/22  Yes Volney American, PA-C  acetaminophen (TYLENOL) 325 MG tablet Take 2 tablets (650 mg total) by mouth every 4 (four) hours as needed (for pain scale < 4). 06/26/18   Lawhorn, Lara Mulch, CNM  benzocaine-Menthol (DERMOPLAST) 20-0.5 % AERO Apply 1 application topically as needed for irritation (perineal discomfort). 08/06/20   Sigmon, Tyler Deis, CNM  ibuprofen (ADVIL) 600 MG tablet Take 1 tablet (600 mg total) by mouth every 6 (six) hours. 08/06/20   Sigmon, Tyler Deis, CNM  IRON PO Take 1 tablet by mouth daily.     [provider]  Prenatal Vit-Fe Fumarate-FA (PRENATAL MULTIVITAMIN) TABS tablet Take 1 tablet by mouth daily at 12 noon.    [provider]    Family History Family History  Problem Relation Age of Onset   Hypertension Mother    Cancer Paternal Grandfather        skin    Anxiety disorder Sister    Depression  Sister    Pulmonary embolism Sister    Hypertension Maternal Grandmother     Social History Social History   Tobacco Use   Smoking status: Never   Smokeless tobacco: Never  Vaping Use   Vaping Use: Never used  Substance Use Topics   Alcohol use: No   Drug use: No     Allergies   Patient has no known allergies.   Review of Systems Review of Systems PER HPI  Physical Exam Triage Vital Signs ED Triage Vitals  Enc Vitals Group     BP 12/18/22 1229 108/72     Pulse Rate 12/18/22 1229 99     Resp 12/18/22 1229 18     Temp --      Temp Source 12/18/22 1229 Oral     SpO2 12/18/22 1229 99 %     Weight --      Height --      Head Circumference --      Peak Flow --      Pain Score 12/18/22 1232 6     Pain Loc --      Pain Edu? --      Excl. in [place]? --    No data  found.  Updated Vital Signs BP 108/72 (BP Location: Right Arm)   Pulse 99   Resp 18   SpO2 99%   Visual Acuity Right Eye Distance:   Left Eye Distance:   Bilateral Distance:    Right Eye Near:   Left Eye Near:    Bilateral Near:     Physical Exam Vitals and nursing note reviewed.  Constitutional:      Appearance: Normal appearance. She is not ill-appearing.  HENT:     Head: Atraumatic.     Mouth/Throat:     Mouth: Mucous membranes are moist.     Pharynx: Oropharynx is clear.  Eyes:     Extraocular Movements: Extraocular movements intact.     Conjunctiva/sclera: Conjunctivae normal.  Cardiovascular:     Rate and Rhythm: Normal rate and regular rhythm.     Heart sounds: Normal heart sounds.  Pulmonary:     Effort: Pulmonary effort is normal.     Breath sounds: Normal breath sounds.  Abdominal:     General: Bowel sounds are normal. There is no distension.     Palpations: Abdomen is soft.     Tenderness: There is abdominal tenderness. There is no right CVA tenderness, left CVA tenderness or guarding.  Musculoskeletal:        General: Normal range of motion.     Cervical back: Normal range of motion and neck supple.  Skin:    General: Skin is warm and dry.  Neurological:     Mental Status: She is alert and oriented to person, place, and time.  Psychiatric:        Mood and Affect: Mood normal.        Thought Content: Thought content normal.        Judgment: Judgment normal.    UC Treatments / Results  Labs (all labs ordered are listed, but only abnormal results are displayed) Labs Reviewed  POCT URINALYSIS DIP (MANUAL ENTRY) - Abnormal; Notable for the following components:      Result Value   Leukocytes, UA Small (1+) (*)    All other components within normal limits  URINE CULTURE    EKG   Radiology No results found.  Procedures Procedures (including critical care time)  Medications Ordered in  UC Medications - No data to display  Initial  Impression / Assessment and Plan / UC Course  I have reviewed the triage vital signs and the nursing notes.  Pertinent labs & imaging results that were available during my care of the patient were reviewed by me and considered in my medical decision making (see chart for details).     Vital signs and exam overall reassuring today with no red flag findings.  Possibly viral GI illness, will treat with Zofran, Carafate, brat diet, fluids and monitor for improvement.  Urine culture also pending due to small leuks in urine sample today.  Low suspicion for UTI at this time.  Knows to go to the emergency department for any significantly worsening symptoms.  Work note given.  Final Clinical Impressions(s) / UC Diagnoses   Final diagnoses:  Nausea and vomiting, unspecified vomiting type  Generalized abdominal pain   Discharge Instructions   None    ED Prescriptions     Medication Sig Dispense Auth. Provider   ondansetron (ZOFRAN-ODT) 4 MG disintegrating tablet Take 1 tablet (4 mg total) by mouth every 8 (eight) hours as needed for nausea or vomiting. 20 tablet Volney American, PA-C   sucralfate (CARAFATE) 1 g tablet Take 1 tablet (1 g total) by mouth 3 (three) times daily as needed. May dissolve 1 tablet into a glass of water and drink 3 times daily as needed to help soothe stomach 60 tablet Volney American, [place]      PDMP not reviewed this encounter.   Volney American, [place] 12/18/22 1325

## 2022-12-18 NOTE — ED Triage Notes (Addendum)
Pt reports she has abdominal pain that started in the upper region and now it is in her lower region that radiates to her sides x 1 day. Says she vomited yesterday night but is unsure if this is related. Pt reports no urinary changes --odor, frequency, urgency, or low back pain.

## 2022-12-20 LAB — URINE CULTURE: Culture: 10000 — AB

## 2023-01-12 ENCOUNTER — Ambulatory Visit: Payer: 59 | Admitting: Family Medicine

## 2023-01-12 ENCOUNTER — Ambulatory Visit: Payer: BC Managed Care – PPO | Admitting: Family Medicine

## 2023-02-21 ENCOUNTER — Encounter: Payer: Self-pay | Admitting: Family Medicine

## 2023-02-21 ENCOUNTER — Ambulatory Visit: Payer: 59 | Admitting: Family Medicine

## 2023-02-21 VITALS — BP 114/79 | Ht 63.0 in | Wt 199.6 lb

## 2023-02-21 DIAGNOSIS — Z13220 Encounter for screening for lipoid disorders: Secondary | ICD-10-CM

## 2023-02-21 DIAGNOSIS — Z0000 Encounter for general adult medical examination without abnormal findings: Secondary | ICD-10-CM

## 2023-02-21 DIAGNOSIS — Z862 Personal history of diseases of the blood and blood-forming organs and certain disorders involving the immune mechanism: Secondary | ICD-10-CM

## 2023-02-21 DIAGNOSIS — E669 Obesity, unspecified: Secondary | ICD-10-CM | POA: Diagnosis not present

## 2023-02-21 DIAGNOSIS — Z0001 Encounter for general adult medical examination with abnormal findings: Secondary | ICD-10-CM

## 2023-02-21 DIAGNOSIS — Z1159 Encounter for screening for other viral diseases: Secondary | ICD-10-CM

## 2023-02-21 NOTE — Patient Instructions (Signed)
Labs today.  Follow up with OB GYN annually.  Follow up annually.  Call with concerns.  Take care  Dr. Adriana Simas

## 2023-02-22 DIAGNOSIS — E669 Obesity, unspecified: Secondary | ICD-10-CM | POA: Insufficient documentation

## 2023-02-22 LAB — CMP14+EGFR
ALT: 11 IU/L (ref 0–32)
AST: 16 IU/L (ref 0–40)
Albumin/Globulin Ratio: 1.7 (ref 1.2–2.2)
Albumin: 4.4 g/dL (ref 4.0–5.0)
Alkaline Phosphatase: 72 IU/L (ref 44–121)
BUN/Creatinine Ratio: 14 (ref 9–23)
BUN: 9 mg/dL (ref 6–20)
Bilirubin Total: 0.3 mg/dL (ref 0.0–1.2)
CO2: 19 mmol/L — ABNORMAL LOW (ref 20–29)
Calcium: 9.4 mg/dL (ref 8.7–10.2)
Chloride: 105 mmol/L (ref 96–106)
Creatinine, Ser: 0.64 mg/dL (ref 0.57–1.00)
Globulin, Total: 2.6 g/dL (ref 1.5–4.5)
Glucose: 87 mg/dL (ref 70–99)
Potassium: 4.2 mmol/L (ref 3.5–5.2)
Sodium: 143 mmol/L (ref 134–144)
Total Protein: 7 g/dL (ref 6.0–8.5)
eGFR: 123 mL/min/{1.73_m2} (ref >59)

## 2023-02-22 LAB — CBC
Hematocrit: 37.6 % (ref 34.0–46.6)
Hemoglobin: 12.4 g/dL (ref 11.1–15.9)
MCH: 27.2 pg (ref 26.6–33.0)
MCHC: 33 g/dL (ref 31.5–35.7)
MCV: 83 fL (ref 79–97)
Platelets: 266 10*3/uL (ref 150–450)
RBC: 4.56 x10E6/uL (ref 3.77–5.28)
RDW: 13.2 % (ref 11.7–15.4)
WBC: 7.3 10*3/uL (ref 3.4–10.8)

## 2023-02-22 LAB — LIPID PANEL
Chol/HDL Ratio: 2.1 ratio (ref 0.0–4.4)
Cholesterol, Total: 131 mg/dL (ref 100–199)
HDL: 63 mg/dL (ref >39)
LDL Chol Calc (NIH): 45 mg/dL (ref 0–99)
Triglycerides: 134 mg/dL (ref 0–149)
VLDL Cholesterol Cal: 23 mg/dL (ref 5–40)

## 2023-02-22 LAB — HEPATITIS C ANTIBODY: Hep C Virus Ab: NONREACTIVE

## 2023-02-22 NOTE — Assessment & Plan Note (Signed)
Screening labs today including hepatitis C. HIV screening has been done. Patient states that her Pap smear and tetanus is up-to-date.  Need records. Follow-up annually.

## 2023-02-22 NOTE — Progress Notes (Signed)
Subjective:  Patient ID: Megan Lowe, female    DOB: May 24, 1991  Age: 32 y.o. MRN: [id number]  CC: Chief Complaint  Patient presents with   Establish Care    Patient needs physical for [employer] insurance- has GYN for female exam    HPI:  32 year old female presents today for an annual physical exam.  Patient states that her tetanus is up-to-date.  Need records.  Patient also states that her Pap smear is up-to-date.  She sees an OB/GYN in [place] [facility].  Patient states that she is doing well.  She has no particular complaints or concerns at this time.    Patient Active Problem List   Diagnosis Date Noted   Obesity (BMI 30-39.9) 02/22/2023   Annual physical exam 05/01/2017    Social Hx   Social History   Socioeconomic History   Marital status: Married    Spouse name: Not on file   Number of children: Not on file   Years of education: Not on file   Highest education level: Not on file  Occupational History   Not on file  Tobacco Use   Smoking status: Never   Smokeless tobacco: Never  Vaping Use   Vaping Use: Never used  Substance and Sexual Activity   Alcohol use: No   Drug use: No   Sexual activity: Yes    Birth control/protection: None  Other Topics Concern   Not on file  Social History Narrative   Not on file   Social Determinants of Health   Financial Resource Strain: Low Risk  (06/24/2018)   Overall Financial Resource Strain (CARDIA)    Difficulty of Paying Living Expenses: Not hard at all  Food Insecurity: No Food Insecurity (06/24/2018)   Hunger Vital Sign    Worried About Running Out of Food in the Last Year: Never true    Ran Out of Food in the Last Year: Never true  Transportation Needs: No Transportation Needs (06/24/2018)   PRAPARE - Administrator, Civil Service (Medical): No    Lack of Transportation (Non-Medical): No  Physical Activity: Insufficiently Active (06/24/2018)   Exercise Vital Sign    Days of Exercise  per Week: 2 days    Minutes of Exercise per Session: 20 min  Stress: No Stress Concern Present (06/24/2018)   [employer] of Occupational Health - Occupational Stress Questionnaire    Feeling of Stress : Not at all  Social Connections: Socially Integrated (06/24/2018)   Social Connection and Isolation Panel [NHANES]    Frequency of Communication with Friends and Family: Three times a week    Frequency of Social Gatherings with Friends and Family: Three times a week    Attends Religious Services: More than 4 times per year    Active Member of Clubs or Organizations: Yes    Attends Banker Meetings: More than 4 times per year    Marital Status: Married    Review of Systems  Constitutional: Negative.   Respiratory: Negative.    Cardiovascular: Negative.     Objective:  BP 114/79   Ht 5\' 3"  (1.6 m)   Wt 199 lb 9.6 oz (90.5 kg)   BMI 35.36 kg/m      02/21/2023   10:30 AM 12/18/2022   12:29 PM 08/06/2020    6:25 AM  BP/Weight  Systolic BP 114 108 103  Diastolic BP 79 72 68  Wt. (Lbs) 199.6    BMI 35.36 kg/m2  Physical Exam Vitals and nursing note reviewed.  Constitutional:      General: She is not in acute distress.    Appearance: Normal appearance. She is obese.  HENT:     Head: Normocephalic and atraumatic.  Eyes:     General:        Right eye: No discharge.        Left eye: No discharge.     Conjunctiva/sclera: Conjunctivae normal.  Cardiovascular:     Rate and Rhythm: Normal rate and regular rhythm.  Pulmonary:     Effort: Pulmonary effort is normal.     Breath sounds: Normal breath sounds. No wheezing, rhonchi or rales.  Neurological:     Mental Status: She is alert.  Psychiatric:        Mood and Affect: Mood normal.        Behavior: Behavior normal.     Lab Results  Component Value Date   WBC 7.3 02/21/2023   HGB 12.4 02/21/2023   HCT 37.6 02/21/2023   PLT 266 02/21/2023   GLUCOSE 87 02/21/2023   CHOL 131 02/21/2023   TRIG 134  02/21/2023   HDL 63 02/21/2023   LDLCALC 45 02/21/2023   ALT 11 02/21/2023   AST 16 02/21/2023   NA 143 02/21/2023   K 4.2 02/21/2023   CL 105 02/21/2023   CREATININE 0.64 02/21/2023   BUN 9 02/21/2023   CO2 19 (L) 02/21/2023     Assessment & Plan:   Problem List Items Addressed This Visit       Other   Annual physical exam - Primary    Screening labs today including hepatitis C. HIV screening has been done. Patient states that her Pap smear and tetanus is up-to-date.  Need records. Follow-up annually.      Obesity (BMI 30-39.9)   Relevant Orders   CMP14+EGFR (Completed)   Other Visit Diagnoses     History of anemia       Relevant Orders   CBC (Completed)   Screening, lipid       Relevant Orders   Lipid panel (Completed)   Need for hepatitis C screening test       Relevant Orders   Hepatitis C Antibody (Completed)       Follow-up:  Return in about 1 year (around 02/21/2024).  Everlene Other DO [facility]

## 2023-02-22 NOTE — Telephone Encounter (Signed)
Tommie Sams, DO     Our insurance no longer covers the new weight loss injections. Recommend referral to nutrition and regular exercise.

## 2023-03-26 ENCOUNTER — Telehealth: Payer: 59 | Admitting: Physician Assistant

## 2023-03-26 DIAGNOSIS — J069 Acute upper respiratory infection, unspecified: Secondary | ICD-10-CM | POA: Diagnosis not present

## 2023-03-26 MED ORDER — PROMETHAZINE-DM 6.25-15 MG/5ML PO SYRP: 5.0000 mL | ORAL_SOLUTION | Freq: Four times a day (QID) | ORAL | 0 refills | Status: DC | PRN

## 2023-03-26 MED ORDER — FLUTICASONE PROPIONATE 50 MCG/ACT NA SUSP: 2.0000 | Freq: Every day | NASAL | 0 refills | Status: DC

## 2023-03-26 NOTE — Progress Notes (Signed)

## 2023-05-07 NOTE — Progress Notes (Deleted)
Office: [phone]  /  Fax: [phone]   TeleHealth Visit:  This visit was completed with telemedicine (audio/video) technology. Megan Lowe has verbally consented to this TeleHealth visit. The patient is located at home, the provider is located at home. The participants in this visit include the listed provider and patient. The visit was conducted today via MyChart video.  Initial Visit  Megan Lowe was seen via virtual visit today to evaluate for treatment of obesity. She is interested in losing weight to improve overall health and reduce the risk of weight related complications. She presents today to review program treatment options, initial physical assessment, and evaluation.     Height: 5\' 3"  Weight: 199 lbs BMI: 35  She was referred by: {emreferby:28303}  When asked what else they would like to accomplish? She states: {EMHopetoaccomplish:28304}  Weight history: ***  When asked how has your weight affected you? She states: {EMWeightAffected:28305}  Some associated conditions: {EMSomeConditions:28306}  Contributing factors: {EMcontributingfactors:28307}  Weight promoting medications identified: {EMWeightpromotingrx:28308}  Current nutrition plan: {EMNutritionplan:28309::"None"}  Current level of physical activity: {EMcurrentPA:28310::"None"}  Current or previous pharmacotherapy: {EM previousRx:28311}  Response to medication: {EMResponsetomedication:28312}  Past medical history includes:   Past Medical History:  Diagnosis Date   Contraceptive management 07/28/2013   Dyspareunia 11/04/2014   Family history of adverse reaction to anesthesia    nausea and vomitting   Medical history non-contributory    Vaginal discharge 11/04/2014   Yeast infection 11/04/2014     Objective:     General:  Alert, oriented and cooperative. Patient is in no acute distress.  Respiratory: Normal respiratory effort, no problems with respiration noted  Mental Status: Normal mood  and affect. Normal behavior. Normal judgment and thought content.    Assessment and Plan:   1. ***  2. ***  3. Obesity Treatment / Action Plan:  Will complete provided nutritional and psychosocial assessment questionnaire before the next appointment. Will be scheduled for indirect calorimetry to determine resting energy expenditure in a fasting state.  This will allow [place] to create a reduced calorie, high-protein meal plan to promote loss of fat mass while preserving muscle mass. Was counseled on pharmacotherapy and role as an adjunct in weight management.   Obesity Education Performed Today:  We discussed obesity as a disease and the importance of a more detailed evaluation of all the factors contributing to the disease.  We discussed the importance of long term lifestyle changes which include nutrition, exercise and behavioral modifications as well as the importance of customizing this to her specific health and social needs.  We discussed the benefits of reaching a healthier weight to alleviate the symptoms of existing conditions and reduce the risks of the biomechanical, metabolic and psychological effects of obesity.  Megan Lowe appears to be in the action stage of change and states they are ready to start intensive lifestyle modifications and behavioral modifications.  ______________________________________________________________________________  She will be contacted by [facility] to set up initial appointment and the first follow up appointment with a physician.   The following office policies were discussed. She voiced understanding: - Patient will be considered late at 6 minutes past appointment time.   - For the first office visit, patient needs to arrive 1 hour early, fasting except for water. Patient should arrive 15 minutes early for all other visits. -  Patient will bring completed new patient paperwork to first office visit.  If not, appointment  will be rescheduled.  30 minutes was spent today on  this visit including the above counseling, pre-visit chart review, and post-visit documentation.  Reviewed by clinician on day of visit: allergies, medications, problem list, medical history, surgical history, family history, social history, and previous encounter notes pertinent to obesity diagnosis.    Jesse Sans, FNP

## 2023-05-08 ENCOUNTER — Telehealth (INDEPENDENT_AMBULATORY_CARE_PROVIDER_SITE_OTHER): Payer: Self-pay | Admitting: Family Medicine

## 2023-05-08 DIAGNOSIS — Z6835 Body mass index (BMI) 35.0-35.9, adult: Secondary | ICD-10-CM

## 2023-05-08 DIAGNOSIS — E669 Obesity, unspecified: Secondary | ICD-10-CM

## 2023-05-08 NOTE — Telephone Encounter (Signed)
Patient was a no-show for virtual visit.  Waited on the virtual visit until 6 minutes past appointment time per office policy.  Left VM advising patient to call to reschedule.  

## 2023-08-23 ENCOUNTER — Ambulatory Visit: Payer: 59 | Admitting: Family Medicine

## 2023-11-10 ENCOUNTER — Ambulatory Visit (INDEPENDENT_AMBULATORY_CARE_PROVIDER_SITE_OTHER): Payer: 59

## 2023-11-10 ENCOUNTER — Ambulatory Visit
Admission: EM | Admit: 2023-11-10 | Discharge: 2023-11-10 | Disposition: A | Discharge status: Home / Self Care | Payer: 59 | Attending: Family Medicine | Admitting: Family Medicine

## 2023-11-10 DIAGNOSIS — W19XXXA Unspecified fall, initial encounter: Secondary | ICD-10-CM | POA: Diagnosis not present

## 2023-11-10 DIAGNOSIS — M25572 Pain in left ankle and joints of left foot: Secondary | ICD-10-CM

## 2023-11-10 DIAGNOSIS — M79672 Pain in left foot: Secondary | ICD-10-CM | POA: Diagnosis not present

## 2023-11-10 DIAGNOSIS — M7732 Calcaneal spur, left foot: Secondary | ICD-10-CM | POA: Diagnosis not present

## 2023-11-10 NOTE — ED Triage Notes (Signed)
Pt reports she fell down the steps about 1:30pm today and now has left foot and ankle pain.

## 2023-11-10 NOTE — Discharge Instructions (Signed)
We will let you know if your x-ray comes back abnormal.  We have given you a walking boot to help with your pain and you may use ice, elevation, ibuprofen and Tylenol.

## 2023-11-14 DIAGNOSIS — S96912A Strain of unspecified muscle and tendon at ankle and foot level, left foot, initial encounter: Secondary | ICD-10-CM | POA: Diagnosis not present

## 2023-11-14 NOTE — ED Provider Notes (Signed)
[facility]    CSN: [id number] Arrival date & time: 11/10/23  1407      History   Chief Complaint No chief complaint on file.   HPI Megan Lowe is a 33 y.o. female.   Patient presenting today with new onset left foot and ankle pain after falling down a step this afternoon.  Has swelling to both areas and significant pain with weightbearing.  Denies numbness, tingling, discoloration, complete loss of range of motion.  So far trying over-the-counter pain relievers with minimal relief.    Past Medical History:  Diagnosis Date   Contraceptive management 07/28/2013   Dyspareunia 11/04/2014   Family history of adverse reaction to anesthesia    nausea and vomitting   Medical history non-contributory    Vaginal discharge 11/04/2014   Yeast infection 11/04/2014    Patient Active Problem List   Diagnosis Date Noted   Obesity (BMI 30-39.9) 02/22/2023   Annual physical exam 05/01/2017    Past Surgical History:  Procedure Laterality Date   NO PAST SURGERIES      OB History     Gravida  4   Para  2   Term  2   Preterm  0   AB  2   Living  2      SAB  0   IAB  1   Ectopic  1   Multiple  0   Live Births  2            Home Medications    Prior to Admission medications   Medication Sig Start Date End Date Taking? Authorizing Provider  fluticasone (FLONASE) 50 MCG/ACT nasal spray Place 2 sprays into both nostrils daily. 03/26/23   Margaretann Loveless, PA-C  promethazine-dextromethorphan (PROMETHAZINE-DM) 6.25-15 MG/5ML syrup Take 5 mLs by mouth 4 (four) times daily as needed. 03/26/23   Margaretann Loveless, PA-C    Family History Family History  Problem Relation Age of Onset   Hypertension Mother    Cancer Paternal Grandfather        skin    Anxiety disorder Sister    Depression Sister    Pulmonary embolism Sister    Hypertension Maternal Grandmother     Social History Social History   Tobacco Use   Smoking status: Never    Smokeless tobacco: Never  Vaping Use   Vaping status: Never Used  Substance Use Topics   Alcohol use: No   Drug use: No     Allergies   Patient has no known allergies.   Review of Systems Review of Systems Per HPI  Physical Exam Triage Vital Signs ED Triage Vitals  Encounter Vitals Group     BP 11/10/23 1532 111/76     Systolic BP Percentile --      Diastolic BP Percentile --      Pulse Rate 11/10/23 1532 88     Resp 11/10/23 1532 18     Temp 11/10/23 1532 98.1 F (36.7 C)     Temp Source 11/10/23 1532 Oral     SpO2 11/10/23 1532 98 %     Weight --      Height --      Head Circumference --      Peak Flow --      Pain Score 11/10/23 1530 10     Pain Loc --      Pain Education --      Exclude from Growth Chart --    No  data found.  Updated Vital Signs BP 111/76 (BP Location: Right Arm)   Pulse 88   Temp 98.1 F (36.7 C) (Oral)   Resp 18   SpO2 98%   Visual Acuity Right Eye Distance:   Left Eye Distance:   Bilateral Distance:    Right Eye Near:   Left Eye Near:    Bilateral Near:     Physical Exam Vitals and nursing note reviewed.  Constitutional:      Appearance: Normal appearance. She is not ill-appearing.  HENT:     Head: Atraumatic.  Eyes:     Extraocular Movements: Extraocular movements intact.     Conjunctiva/sclera: Conjunctivae normal.  Cardiovascular:     Rate and Rhythm: Normal rate and regular rhythm.     Heart sounds: Normal heart sounds.  Pulmonary:     Effort: Pulmonary effort is normal.     Breath sounds: Normal breath sounds.  Musculoskeletal:        General: Swelling, tenderness and signs of injury present. Normal range of motion.     Cervical back: Normal range of motion and neck supple.     Comments: Left lateral foot and ankle tender to palpation and mildly edematous.  No bony deformity palpable.  Skin:    General: Skin is warm and dry.     Findings: No bruising or erythema.  Neurological:     Mental Status: She is  alert and oriented to person, place, and time.     Motor: No weakness.     Gait: Gait normal.     Comments: Left lower extremity neurovascularly intact  Psychiatric:        Mood and Affect: Mood normal.        Thought Content: Thought content normal.        Judgment: Judgment normal.      UC Treatments / Results  Labs (all labs ordered are listed, but only abnormal results are displayed) Labs Reviewed - No data to display  EKG   Radiology No results found.  Procedures Procedures (including critical care time)  Medications Ordered in UC Medications - No data to display  Initial Impression / Assessment and Plan / UC Course  I have reviewed the triage vital signs and the nursing notes.  Pertinent labs & imaging results that were available during my care of the patient were reviewed by me and considered in my medical decision making (see chart for details).     X-ray of the left foot and ankle negative for acute bony abnormalities.  Discussed RICE Brokaw, over-the-counter pain relievers and supportive home care.  Return for worsening symptoms.  Final Clinical Impressions(s) / UC Diagnoses   Final diagnoses:  Acute left ankle pain  Fall, initial encounter     Discharge Instructions      We will let you know if your x-ray comes back abnormal.  We have given you a walking boot to help with your pain and you may use ice, elevation, ibuprofen and Tylenol.    ED Prescriptions   None    PDMP not reviewed this encounter.   Particia Nearing, [place] 11/14/23 1929

## 2023-12-14 ENCOUNTER — Encounter: Payer: Self-pay | Admitting: Physician Assistant

## 2023-12-14 ENCOUNTER — Ambulatory Visit: Payer: 59 | Admitting: Physician Assistant

## 2023-12-14 VITALS — BP 116/82 | HR 86 | Temp 98.2°F | Ht 63.0 in | Wt 219.0 lb

## 2023-12-14 DIAGNOSIS — G43011 Migraine without aura, intractable, with status migrainosus: Secondary | ICD-10-CM

## 2023-12-14 MED ORDER — RIZATRIPTAN BENZOATE 10 MG PO TABS: 10.0000 mg | ORAL_TABLET | ORAL | 0 refills | Status: AC | PRN

## 2023-12-14 NOTE — Assessment & Plan Note (Signed)
 Blood pressure, visual acuity and neuro exam reassuring.  Advised avoiding overuse Ibuprofen/Tylenol to avoid rebound headaches. Maxalt trial for migraines.  Keep well hydrated with plenty of water.  Minimize screen time when possible.  Record headaches in diary and bring to follow up, if headaches persist.

## 2023-12-14 NOTE — Progress Notes (Signed)
 Acute Office Visit  Subjective:     Patient ID: Megan Lowe, female    DOB: 1991/02/04, 33 y.o.   MRN: [id number]   Headache  This is a chronic problem. The current episode started more than 1 month ago. Episode frequency: every few days, 2-3 times a week, progressing to migraines 2-3 times a month. The problem has been unchanged. The pain is located in the Frontal and retro-orbital region. The pain does not radiate. The pain quality is similar to prior headaches. The quality of the pain is described as aching, band-like, dull and pulsating. Associated symptoms include photophobia (photosensitive). Pertinent negatives include no coughing, dizziness, fever, hearing loss, neck pain or visual change. The symptoms are aggravated by bright light. She has tried acetaminophen and NSAIDs for the symptoms. Her past medical history is significant for obesity.    Review of Systems  Constitutional:  Negative for fever and malaise/fatigue.  HENT:  Negative for congestion and hearing loss.   Eyes:  Positive for photophobia (photosensitive).  Respiratory:  Negative for cough.   Musculoskeletal:  Negative for neck pain.  Neurological:  Positive for headaches. Negative for dizziness and loss of consciousness.        Objective:     BP 116/82   Pulse 86   Temp 98.2 F (36.8 C)   Ht 5\' 3"  (1.6 m)   Wt 219 lb (99.3 kg)   SpO2 99%   BMI 38.79 kg/m   Physical Exam Constitutional:      Appearance: Normal appearance.  HENT:     Head: Normocephalic.     Mouth/Throat:     Mouth: Mucous membranes are moist.     Pharynx: Oropharynx is clear.  Eyes:     General: No visual field deficit.    Extraocular Movements: Extraocular movements intact.     Right eye: No nystagmus.     Left eye: No nystagmus.     Conjunctiva/sclera: Conjunctivae normal.     Pupils: Pupils are equal, round, and reactive to light.  Cardiovascular:     Rate and Rhythm: Normal rate and regular rhythm.     Heart sounds:  No murmur heard.    No gallop.  Pulmonary:     Effort: Pulmonary effort is normal.     Breath sounds: No wheezing, rhonchi or rales.  Musculoskeletal:     Cervical back: Normal range of motion.     Right lower leg: No edema.     Left lower leg: No edema.  Skin:    General: Skin is warm and dry.  Neurological:     General: No focal deficit present.     Mental Status: She is alert and oriented to person, place, and time. Mental status is at baseline.     Cranial Nerves: No cranial nerve deficit or dysarthria.  Psychiatric:        Mood and Affect: Mood normal.        Behavior: Behavior normal.    No results found for any visits on 12/14/23.     Assessment & Plan:  Migraine without aura, with intractable migraine, so stated, with status migrainosus Assessment & Plan: Blood pressure, visual acuity and neuro exam reassuring.  Advised avoiding overuse Ibuprofen/Tylenol to avoid rebound headaches. Maxalt trial for migraines.  Keep well hydrated with plenty of water.  Minimize screen time when possible.  Record headaches in diary and bring to follow up, if headaches persist.   Orders: -     Rizatriptan Benzoate;  Take 1 tablet (10 mg total) by mouth as needed for migraine. May repeat in 2 hours if needed  Dispense: 10 tablet; Refill: 0    Return in about 2 months (around 02/11/2024).  Toni Amend Neizan Debruhl, PA-C

## 2024-01-16 ENCOUNTER — Ambulatory Visit: Payer: 59 | Admitting: Family Medicine

## 2024-03-26 DIAGNOSIS — Z111 Encounter for screening for respiratory tuberculosis: Secondary | ICD-10-CM | POA: Diagnosis not present

## 2024-03-27 DIAGNOSIS — Z23 Encounter for immunization: Secondary | ICD-10-CM | POA: Diagnosis not present

## 2024-04-24 ENCOUNTER — Telehealth: Admitting: Physician Assistant

## 2024-04-24 DIAGNOSIS — T7840XA Allergy, unspecified, initial encounter: Secondary | ICD-10-CM

## 2024-04-24 DIAGNOSIS — W57XXXA Bitten or stung by nonvenomous insect and other nonvenomous arthropods, initial encounter: Secondary | ICD-10-CM

## 2024-04-24 DIAGNOSIS — S40869A Insect bite (nonvenomous) of unspecified upper arm, initial encounter: Secondary | ICD-10-CM | POA: Diagnosis not present

## 2024-04-24 DIAGNOSIS — Z23 Encounter for immunization: Secondary | ICD-10-CM | POA: Diagnosis not present

## 2024-04-24 MED ORDER — PREDNISONE 20 MG PO TABS: 40.0000 mg | ORAL_TABLET | Freq: Every day | ORAL | 0 refills | Status: DC

## 2024-04-24 NOTE — Progress Notes (Signed)
 I have spent 5 minutes in review of e-visit questionnaire, review and updating patient chart, medical decision making and response to patient.   Piedad Climes, PA-C

## 2024-04-24 NOTE — Progress Notes (Signed)
 E-Visit for Insect Sting  Thank you for describing the insect bite/sting for us .  Here is how we plan to help!  Based on what you have shared you have a bite/sting that we will treat with a short course of prednisone . Thankfully this does not seem like a tick-borne rash/illness.   The 2 greatest risks from insect stings are allergic reaction, which can be fatal in some people and infection, which is more common and less serious.  Bees, wasps, yellow jackets, and hornets belong to a class of insects called Hymenoptera.  Most insect stings cause only minor discomfort.  Stings can happen anywhere on the body and can be painful.  Most stings are from honey bees or yellow jackets.  Fire ants can sting multiple times.  The sites of the stings are more likely to become infected.    Based on your information I have:, Provided a home care guide for insect stings and instructions on when to call for help., and I have sent in prednisone  40 mg by mouth daily for 5 days to the pharmacy you selected.  Please make sure that you selected a pharmacy that is open now.  What can be used to prevent Insect Stings?  Insect repellant with at least 20% DEET.  Wearing long pants and shirts with socks and shoes.  Wear dark or drab-colored clothes rather than bright colors.  Avoid using perfumes and hair sprays; these attract insects.  HOME CARE ADVICE:  1. Stinger removal: The stinger looks like a tiny black dot in the sting. Use a fingernail, credit card edge, or knife-edge to scrape it off.  Don't pull it out because it squeezes out more venom. If the stinger is below the skin surface, leave it alone.  It will be shed with normal skin healing. 2. Use cold compresses to the area of the sting for 10-20 minutes.  You may repeat this as needed to relieve symptoms of pain and swelling. 3.  For pain relief, take acetominophen 650 mg 4-6 hours as needed or ibuprofen  400 mg every 6-8 hours as needed or naproxen 250-500  mg every 12 hours as needed. 4.  You can also use hydrocortisone cream 0.5% or 1% up to 4 times daily as needed for itching. 5.  If the sting becomes very itchy, take Benadryl  25-50 mg, follow directions on box. 6.  Wash the area 2-3 times daily with antibacterial soap and warm water . 7. Call your Doctor if: Fever, a severe headache, or rash occur in the next 2 weeks. Sting area begins to look infected. Redness and swelling worsens after home treatment. Your current symptoms become worse.    MAKE SURE YOU:  Understand these instructions. Will watch your condition. Will get help right away if you are not doing well or get worse.  Thank you for choosing an e-visit.  Your e-visit answers were reviewed by a board certified advanced clinical practitioner to complete your personal care plan. Depending upon the condition, your plan could have included both over the counter or prescription medications.  Please review your pharmacy choice. Make sure the pharmacy is open so you can pick up prescription now. If there is a problem, you may contact your provider through [employer] and have the prescription routed to another pharmacy.  Your safety is important to us . If you have drug allergies check your prescription carefully.   For the next 24 hours you can use MyChart to ask questions about today's visit, request a non-urgent  call back, or ask for a work or school excuse. You will get an email in the next two days asking about your experience. I hope that your e-visit has been valuable and will speed your recovery.

## 2024-07-30 ENCOUNTER — Telehealth: Admitting: Family Medicine

## 2024-07-30 DIAGNOSIS — J019 Acute sinusitis, unspecified: Secondary | ICD-10-CM | POA: Diagnosis not present

## 2024-07-30 MED ORDER — FLUTICASONE PROPIONATE 50 MCG/ACT NA SUSP: 2.0000 | Freq: Every day | NASAL | 0 refills | Status: AC

## 2024-07-30 NOTE — Progress Notes (Signed)
 We are sorry that you are not feeling well.  Here is how we plan to help!  Based on what you have shared with me it looks like you have sinusitis.  Sinusitis is inflammation and infection in the sinus cavities of the head.  Based on your presentation I believe you most likely have Acute Viral Sinusitis.This is an infection most likely caused by a virus. There is not specific treatment for viral sinusitis other than to help you with the symptoms until the infection runs its course.  You may use an oral decongestant such as Mucinex  D or if you have glaucoma or high blood pressure use plain Mucinex . Saline nasal spray help and can safely be used as often as needed for congestion, I have prescribed: Fluticasone  nasal spray two sprays in each nostril once a day  Some authorities believe that zinc sprays or the use of Echinacea may shorten the course of your symptoms.  Sinus infections are not as easily transmitted as other respiratory infection, however we still recommend that you avoid close contact with loved ones, especially the very young and elderly.  Remember to wash your hands thoroughly throughout the day as this is the number one way to prevent the spread of infection!  Home Care: Only take medications as instructed by your medical team. Do not take these medications with alcohol. A steam or ultrasonic humidifier can help congestion.  You can place a towel over your head and breathe in the steam from hot water  coming from a faucet. Avoid close contacts especially the very young and the elderly. Cover your mouth when you cough or sneeze. Always remember to wash your hands.  Get Help Right Away If: You develop worsening fever or sinus pain. You develop a severe head ache or visual changes. Your symptoms persist after you have completed your treatment plan.  Make sure you Understand these instructions. Will watch your condition. Will get help right away if you are not doing well or get  worse.  Your e-visit answers were reviewed by a board certified advanced clinical practitioner to complete your personal care plan.  Depending on the condition, your plan could have included both over the counter or prescription medications.  If there is a problem please reply  once you have received a response from your provider.  Your safety is important to us .  If you have drug allergies check your prescription carefully.    You can use MyChart to ask questions about today's visit, request a non-urgent call back, or ask for a work or school excuse for 24 hours related to this e-Visit. If it has been greater than 24 hours you will need to follow up with your provider, or enter a new e-Visit to address those concerns.  You will get an e-mail in the next two days asking about your experience.  I hope that your e-visit has been valuable and will speed your recovery. Thank you for using e-visits.  I have spent 5 minutes in review of e-visit questionnaire, review and updating patient chart, medical decision making and response to patient.   Roosvelt Mater, PA-C

## 2024-09-24 ENCOUNTER — Telehealth: Admitting: Physician Assistant

## 2024-09-24 DIAGNOSIS — B9689 Other specified bacterial agents as the cause of diseases classified elsewhere: Secondary | ICD-10-CM | POA: Diagnosis not present

## 2024-09-24 DIAGNOSIS — J019 Acute sinusitis, unspecified: Secondary | ICD-10-CM | POA: Diagnosis not present

## 2024-09-24 MED ORDER — AMOXICILLIN-POT CLAVULANATE 875-125 MG PO TABS: 1.0000 | ORAL_TABLET | Freq: Two times a day (BID) | ORAL | 0 refills | Status: AC

## 2024-09-24 NOTE — Progress Notes (Signed)
